# Patient Record
Sex: Male | Born: 1985 | Race: White | Hispanic: No | Marital: Married | Smoking: Never smoker
Health system: Southern US, Community
[De-identification: ages and names within clinical notes are randomized; demographics above are authoritative.]

## PROBLEM LIST (undated history)

## (undated) DIAGNOSIS — G43909 Migraine, unspecified, not intractable, without status migrainosus: Secondary | ICD-10-CM

## (undated) DIAGNOSIS — K819 Cholecystitis, unspecified: Secondary | ICD-10-CM

## (undated) DIAGNOSIS — F419 Anxiety disorder, unspecified: Secondary | ICD-10-CM

## (undated) DIAGNOSIS — K838 Other specified diseases of biliary tract: Secondary | ICD-10-CM

## (undated) HISTORY — DX: Other specified diseases of biliary tract: K83.8

## (undated) HISTORY — DX: Cholecystitis, unspecified: K81.9

## (undated) HISTORY — DX: Migraine, unspecified, not intractable, without status migrainosus: G43.909

## (undated) HISTORY — DX: Anxiety disorder, unspecified: F41.9

---

## 2017-01-07 ENCOUNTER — Encounter: Payer: Self-pay | Admitting: Emergency Medicine

## 2017-01-07 ENCOUNTER — Emergency Department
Admission: EM | Admit: 2017-01-07 | Discharge: 2017-01-07 | Disposition: A | Discharge status: Home / Self Care | Payer: Self-pay | Attending: Emergency Medicine | Admitting: Emergency Medicine

## 2017-01-07 ENCOUNTER — Telehealth: Payer: Self-pay

## 2017-01-07 ENCOUNTER — Emergency Department: Payer: Self-pay

## 2017-01-07 DIAGNOSIS — R079 Chest pain, unspecified: Secondary | ICD-10-CM | POA: Insufficient documentation

## 2017-01-07 LAB — CBC
HEMATOCRIT: 45.2 % (ref 40.0–52.0)
HEMOGLOBIN: 15.3 g/dL (ref 13.0–18.0)
MCH: 29.8 pg (ref 26.0–34.0)
MCHC: 33.9 g/dL (ref 32.0–36.0)
MCV: 87.9 fL (ref 80.0–100.0)
Platelets: 207 10*3/uL (ref 150–440)
RBC: 5.15 MIL/uL (ref 4.40–5.90)
RDW: 14 % (ref 11.5–14.5)
WBC: 8.7 10*3/uL (ref 3.8–10.6)

## 2017-01-07 LAB — BASIC METABOLIC PANEL
ANION GAP: 6 (ref 5–15)
BUN: 18 mg/dL (ref 6–20)
CALCIUM: 9.5 mg/dL (ref 8.9–10.3)
CHLORIDE: 106 mmol/L (ref 101–111)
CO2: 28 mmol/L (ref 22–32)
Creatinine, Ser: 1.11 mg/dL (ref 0.61–1.24)
GFR calc Af Amer: >60 mL/min (ref >60)
GFR calc non Af Amer: >60 mL/min (ref >60)
GLUCOSE: 113 mg/dL — AB (ref 65–99)
POTASSIUM: 3.4 mmol/L — AB (ref 3.5–5.1)
Sodium: 140 mmol/L (ref 135–145)

## 2017-01-07 LAB — TROPONIN I: Troponin I: <0.03 ng/mL (ref <0.03)

## 2017-01-07 NOTE — ED Triage Notes (Signed)
Pt to triage via EMS from home, report CP starting around midnight, radiating to back, w/ SOB and nausea, report took 4 ASA PTA.  Pt reports pain now resolved.

## 2017-01-07 NOTE — ED Provider Notes (Signed)
[facility] Emergency Department Provider Note   ____________________________________________   First MD Initiated Contact with Patient 01/07/17 [id number]     (approximate)  I have reviewed the triage vital signs and the nursing notes.   HISTORY  Chief Complaint Chest Pain    HPI William Dennis is a 31 y.o. male brought to the ED from home via EMS with a chief of chest pain. Patient reports onset of chest pain radiating to his back approximately midnight while getting ready for bed. Describes chest pressure associated with mild shortness of breath and nausea. Denies associated diaphoresis, vomiting, dizziness, palpitations. EMS administered 4 baby aspirin with resolution of symptoms. Patient states he has had more stress recently in his life. Had a similar episode 2 years ago with negative cardiology workup. Cold-like symptoms with cough and chest congestion approximately 2 weeks ago. Denies recent fever, chills, abdominal pain, diarrhea.Denies recent travel, trauma or hormone use.   Past medical history None  There are no active problems to display for this patient.   History reviewed. No pertinent surgical history.  Prior to Admission medications   Not on File    Allergies Patient has no known allergies.  Family history Maternal grandfather with CAD  Social History Social History  Substance Use Topics  . Smoking status: Never Smoker  . Smokeless tobacco: Never Used  . Alcohol use No  Denies illicit drug use.  Review of Systems  Constitutional: No fever/chills. Eyes: No visual changes. ENT: No sore throat. Cardiovascular: Positive for chest pain. Respiratory: Positive for shortness of breath. Gastrointestinal: No abdominal pain.  Positive for nausea, no vomiting.  No diarrhea.  No constipation. Genitourinary: Negative for dysuria. Musculoskeletal: Negative for back pain. Skin: Negative for rash. Neurological: Negative for headaches,  focal weakness or numbness.   ____________________________________________   PHYSICAL EXAM:  VITAL SIGNS: ED Triage Vitals  Enc Vitals Group     BP 01/07/17 0141 117/70     Pulse Rate 01/07/17 0141 63     Resp 01/07/17 0141 16     Temp 01/07/17 0141 98.3 F (36.8 C)     Temp Source 01/07/17 0141 Oral     SpO2 01/07/17 0141 95 %     Weight 01/07/17 0142 220 lb (99.8 kg)     Height 01/07/17 0142 6' (1.829 m)     Head Circumference --      Peak Flow --      Pain Score 01/07/17 0141 0     Pain Loc --      Pain Edu? --      Excl. in GC? --     Constitutional: Alert and oriented. Well appearing and in no acute distress. Eyes: Conjunctivae are normal. PERRL. EOMI. Head: Atraumatic. Nose: No congestion/rhinnorhea. Mouth/Throat: Mucous membranes are moist.  Oropharynx non-erythematous. Neck: No stridor.  No carotid bruits. Cardiovascular: Normal rate, regular rhythm. Grossly normal heart sounds.  Good peripheral circulation. Respiratory: Normal respiratory effort.  No retractions. Lungs CTAB. Gastrointestinal: Soft and nontender. No distention. No abdominal bruits. No CVA tenderness. Musculoskeletal: No lower extremity tenderness nor edema.  No joint effusions. Neurologic:  Normal speech and language. No gross focal neurologic deficits are appreciated. No gait instability. Skin:  Skin is warm, dry and intact. No rash noted. Psychiatric: Mood and affect are normal. Speech and behavior are normal.  ____________________________________________   LABS (all labs ordered are listed, but only abnormal results are displayed)  Labs Reviewed  BASIC METABOLIC PANEL - Abnormal; Notable for  the following:       Result Value   Potassium 3.4 (*)    Glucose, Bld 113 (*)    All other components within normal limits  CBC  TROPONIN I  TROPONIN I   ____________________________________________  EKG  ED ECG REPORT I, SUNG,JADE J, the attending physician, personally viewed and  interpreted this ECG.   Date: 01/07/2017  EKG Time: 0140  Rate: 58  Rhythm: sinus bradycardia  Axis: Normal  Intervals:none  ST&T Change: Nonspecific  ____________________________________________  RADIOLOGY  Dg Chest 2 View  Result Date: 01/07/2017 CLINICAL DATA:  Chest pain EXAM: CHEST  2 VIEW COMPARISON:  None. FINDINGS: The heart size and mediastinal contours are within normal limits. Both lungs are clear. The visualized skeletal structures are unremarkable. IMPRESSION: No active cardiopulmonary disease. Electronically Signed   By: Jasmine Pang M.D.   On: 01/07/2017 02:09    ____________________________________________   PROCEDURES  Procedure(s) performed: None  Procedures  Critical Care performed: No  ____________________________________________   INITIAL IMPRESSION / ASSESSMENT AND PLAN / ED COURSE  Pertinent labs & imaging results that were available during my care of the patient were reviewed by me and considered in my medical decision making (see chart for details).  31 year old male, otherwise healthy, who presents with chest pain. No pain currently. Initial EKG and troponin are unremarkable. Will repeat timed troponin. Low suspicion for CAD, PE, dissection.  ----------------------------------------- 5:19 AM on 01/07/2017 -----------------------------------------   OBSERVATION CARE: This patient is being placed under observation care for the following reasons: Chest pain with repeat testing to rule out ischemia    ----------------------------------------- 5:50 AM on 01/07/2017 -----------------------------------------  Resting in no acute distress.  ----------------------------------------- 6:14 AM on 01/07/2017 -----------------------------------------   END OF OBSERVATION STATUS: After an appropriate period of observation, this patient is being discharged due to the following reason(s):  Repeat negative troponin, no chest pain. Strict return  precautions given. Patient verbalizes understanding and agrees with plan of care.      ____________________________________________   FINAL CLINICAL IMPRESSION(S) / ED DIAGNOSES  Final diagnoses:  Chest pain, unspecified type      NEW MEDICATIONS STARTED DURING THIS VISIT:  New Prescriptions   No medications on file     Note:  This document was prepared using Dragon voice recognition software and may include unintentional dictation errors.    Irean Hong, MD 01/07/17 [phone]

## 2017-01-07 NOTE — Telephone Encounter (Signed)
Lmov for patient to call back and schedule ED fu   Seen on 01/06/17 for CP   Will try again at a later time

## 2017-01-07 NOTE — Discharge Instructions (Signed)
Return to the ER for recurrent or worsening symptoms, persistent vomiting, difficulty breathing or other concerns. 

## 2017-01-18 NOTE — Telephone Encounter (Signed)
Lmov for patient to call back and schedule ED fu   Seen on 01/06/17 for CP   Will try again at a later time

## 2017-01-20 NOTE — Telephone Encounter (Signed)
Unable to contact °Sent letter  °

## 2018-08-22 ENCOUNTER — Other Ambulatory Visit: Payer: Self-pay

## 2018-08-22 ENCOUNTER — Emergency Department
Admission: EM | Admit: 2018-08-22 | Discharge: 2018-08-22 | Disposition: A | Discharge status: Home / Self Care | Payer: BLUE CROSS/BLUE SHIELD | Attending: Emergency Medicine | Admitting: Emergency Medicine

## 2018-08-22 ENCOUNTER — Encounter: Payer: Self-pay | Admitting: Emergency Medicine

## 2018-08-22 ENCOUNTER — Emergency Department: Payer: BLUE CROSS/BLUE SHIELD

## 2018-08-22 DIAGNOSIS — M549 Dorsalgia, unspecified: Secondary | ICD-10-CM | POA: Insufficient documentation

## 2018-08-22 DIAGNOSIS — R0602 Shortness of breath: Secondary | ICD-10-CM | POA: Insufficient documentation

## 2018-08-22 LAB — CBC WITH DIFFERENTIAL/PLATELET
Abs Immature Granulocytes: 0.03 10*3/uL (ref 0.00–0.07)
Basophils Absolute: 0.1 10*3/uL (ref 0.0–0.1)
Basophils Relative: 1 %
EOS ABS: 0.2 10*3/uL (ref 0.0–0.5)
Eosinophils Relative: 2 %
HEMATOCRIT: 45.3 % (ref 39.0–52.0)
Hemoglobin: 15.1 g/dL (ref 13.0–17.0)
Immature Granulocytes: 0 %
LYMPHS ABS: 3.1 10*3/uL (ref 0.7–4.0)
Lymphocytes Relative: 31 %
MCH: 29.5 pg (ref 26.0–34.0)
MCHC: 33.3 g/dL (ref 30.0–36.0)
MCV: 88.5 fL (ref 80.0–100.0)
MONOS PCT: 10 %
Monocytes Absolute: 1 10*3/uL (ref 0.1–1.0)
Neutro Abs: 5.7 10*3/uL (ref 1.7–7.7)
Neutrophils Relative %: 56 %
Platelets: 256 10*3/uL (ref 150–400)
RBC: 5.12 MIL/uL (ref 4.22–5.81)
RDW: 13.3 % (ref 11.5–15.5)
WBC: 10 10*3/uL (ref 4.0–10.5)
nRBC: 0 % (ref 0.0–0.2)

## 2018-08-22 LAB — COMPREHENSIVE METABOLIC PANEL
ALT: 39 U/L (ref 0–44)
ANION GAP: 6 (ref 5–15)
AST: 27 U/L (ref 15–41)
Albumin: 4.5 g/dL (ref 3.5–5.0)
Alkaline Phosphatase: 72 U/L (ref 38–126)
BILIRUBIN TOTAL: 0.5 mg/dL (ref 0.3–1.2)
BUN: 14 mg/dL (ref 6–20)
CALCIUM: 9 mg/dL (ref 8.9–10.3)
CO2: 28 mmol/L (ref 22–32)
CREATININE: 1.09 mg/dL (ref 0.61–1.24)
Chloride: 106 mmol/L (ref 98–111)
GFR calc Af Amer: >60 mL/min (ref >60)
Glucose, Bld: 111 mg/dL — ABNORMAL HIGH (ref 70–99)
POTASSIUM: 3.7 mmol/L (ref 3.5–5.1)
Sodium: 140 mmol/L (ref 135–145)
Total Protein: 7.7 g/dL (ref 6.5–8.1)

## 2018-08-22 LAB — URINALYSIS, COMPLETE (UACMP) WITH MICROSCOPIC
Bacteria, UA: NONE SEEN
Bilirubin Urine: NEGATIVE
GLUCOSE, UA: NEGATIVE mg/dL
Hgb urine dipstick: NEGATIVE
Ketones, ur: NEGATIVE mg/dL
Leukocytes,Ua: NEGATIVE
Nitrite: NEGATIVE
Protein, ur: NEGATIVE mg/dL
Specific Gravity, Urine: 1.02 (ref 1.005–1.030)
Squamous Epithelial / LPF: NONE SEEN (ref 0–5)
pH: 5 (ref 5.0–8.0)

## 2018-08-22 LAB — TROPONIN I

## 2018-08-22 LAB — LIPASE, BLOOD: LIPASE: 39 U/L (ref 11–51)

## 2018-08-22 MED ORDER — IBUPROFEN 600 MG PO TABS: 600.0000 mg | ORAL_TABLET | Freq: Four times a day (QID) | ORAL | 0 refills | Status: DC | PRN

## 2018-08-22 MED ORDER — TRAMADOL HCL 50 MG PO TABS: 50.0000 mg | ORAL_TABLET | Freq: Four times a day (QID) | ORAL | 0 refills | Status: DC | PRN

## 2018-08-22 NOTE — ED Triage Notes (Signed)
Patient ambulatory to triage with steady gait, without difficulty or distress noted; pt reports since yesterday having back pain--st begins in upper back radiating down into lower back and around into lower abd with no accomp symptoms

## 2018-08-22 NOTE — ED Provider Notes (Signed)
[facility] Emergency Department Provider Note ____________________________________________   First MD Initiated Contact with Patient 08/22/18 2112     (approximate)  I have reviewed the triage vital signs and the nursing notes.   HISTORY  Chief Complaint Back Pain    HPI Nerick Hillson is a 33 y.o. male with no significant PMH who presents with back pain, acute onset yesterday and gradually worsening, now radiating around to his chest and to the lateral parts of the abdomen, more on the left.  He states that he was doing some yard work the day prior but no other changes in activity and he denies any specific injury.  The patient reports that the pain has made him feel slightly short of breath but he denies weakness or lightheadedness, fever, vomiting, diarrhea, or urinary symptoms.  No prior history of this pain.    History reviewed. No pertinent past medical history.  There are no active problems to display for this patient.   History reviewed. No pertinent surgical history.  Prior to Admission medications   Medication Sig Start Date End Date Taking? Authorizing Provider  ibuprofen (ADVIL,MOTRIN) 600 MG tablet Take 1 tablet (600 mg total) by mouth every 6 (six) hours as needed. 08/22/18   Dionne Bucy, MD  traMADol (ULTRAM) 50 MG tablet Take 1 tablet (50 mg total) by mouth every 6 (six) hours as needed for up to 5 days for moderate pain. 08/22/18 08/27/18  Dionne Bucy, MD    Allergies Codeine  No family history on file.  Social History Social History   Tobacco Use  . Smoking status: Never Smoker  . Smokeless tobacco: Never Used  Substance Use Topics  . Alcohol use: No  . Drug use: Not on file    Review of Systems  Constitutional: No fever. Eyes: No redness. ENT: No neck pain. Cardiovascular: Positive chest pain. Respiratory: Denies shortness of breath. Gastrointestinal: No vomiting or diarrhea. Genitourinary: Negative for  dysuria.  Musculoskeletal: Positive for back pain. Skin: Negative for rash. Neurological: Negative for headaches, focal weakness or numbness.   ____________________________________________   PHYSICAL EXAM:  VITAL SIGNS: ED Triage Vitals  Enc Vitals Group     BP 08/22/18 2002 (!) 141/89     Pulse Rate 08/22/18 2002 93     Resp 08/22/18 2002 18     Temp 08/22/18 2002 98.1 F (36.7 C)     Temp Source 08/22/18 2002 Oral     SpO2 08/22/18 2002 98 %     Weight 08/22/18 2003 223 lb (101.2 kg)     Height 08/22/18 2003 6' (1.829 m)     Head Circumference --      Peak Flow --      Pain Score 08/22/18 2002 9     Pain Loc --      Pain Edu? --      Excl. in GC? --     Constitutional: Alert and oriented. Well appearing and in no acute distress. Eyes: Conjunctivae are normal.  Head: Atraumatic. Nose: No congestion/rhinnorhea. Mouth/Throat: Mucous membranes are moist.   Neck: Normal range of motion.  No cervical spinal tenderness. Cardiovascular: Normal rate, regular rhythm. Grossly normal heart sounds.  Good peripheral circulation. Respiratory: Normal respiratory effort.  No retractions. Lungs CTAB. Gastrointestinal: Soft and nontender. No distention.  Genitourinary: No flank tenderness. Musculoskeletal: No lower extremity edema.  Extremities warm and well perfused.  No midline or paraspinal back tenderness. Neurologic:  Normal speech and language. No gross focal neurologic  deficits are appreciated.  Skin:  Skin is warm and dry. No rash noted. Psychiatric: Mood and affect are normal. Speech and behavior are normal.  ____________________________________________   LABS (all labs ordered are listed, but only abnormal results are displayed)  Labs Reviewed  COMPREHENSIVE METABOLIC PANEL - Abnormal; Notable for the following components:      Result Value   Glucose, Bld 111 (*)    All other components within normal limits  URINALYSIS, COMPLETE (UACMP) WITH MICROSCOPIC - Abnormal;  Notable for the following components:   Color, Urine YELLOW (*)    APPearance CLEAR (*)    All other components within normal limits  CBC WITH DIFFERENTIAL/PLATELET  TROPONIN I  LIPASE, BLOOD   ____________________________________________  EKG ED ECG REPORT I, Dionne Bucy, the attending physician, personally viewed and interpreted this ECG.  Date: 08/22/2018 EKG Time: 2005 Rate: 98 Rhythm: normal sinus rhythm QRS Axis: normal Intervals: normal ST/T Wave abnormalities: Nonspecific T wave inversion in III Narrative Interpretation: no evidence of acute ischemia  ____________________________________________  RADIOLOGY  XR: No focal infiltrate or other acute abnormality  ____________________________________________   PROCEDURES  Procedure(s) performed: No  Procedures  Critical Care performed: No ____________________________________________   INITIAL IMPRESSION / ASSESSMENT AND PLAN / ED COURSE  Pertinent labs & imaging results that were available during my care of the patient were reviewed by me and considered in my medical decision making (see chart for details).  33 year old male with no significant PMH presents with upper to mid back pain since yesterday, radiating around to his chest and to the left side of his abdomen with no weakness or neurologic symptoms.  On exam the patient is well-appearing and his vital signs are normal.  The remainder of the exam is unremarkable.  He has no midline spinal pain and his neuro exam is nonfocal.  EKG shows no significant abnormalities.  Overall presentation is most consistent with muscle strain/spasm versus mild dehydration, early viral syndrome, or other benign etiology.  We obtained basic labs, troponin, and chest x-ray all of which were negative.  Given the duration of the symptoms and the patient's low risk for ACS, there is no indication for repeat troponin.  ----------------------------------------- 11:05 PM on  08/22/2018 -----------------------------------------  Patient is stable for discharge home.  Return precautions given, and he expresses understanding. ____________________________________________   FINAL CLINICAL IMPRESSION(S) / ED DIAGNOSES  Final diagnoses:  Acute bilateral back pain, unspecified back location      NEW MEDICATIONS STARTED DURING THIS VISIT:  New Prescriptions   IBUPROFEN (ADVIL,MOTRIN) 600 MG TABLET    Take 1 tablet (600 mg total) by mouth every 6 (six) hours as needed.   TRAMADOL (ULTRAM) 50 MG TABLET    Take 1 tablet (50 mg total) by mouth every 6 (six) hours as needed for up to 5 days for moderate pain.     Note:  This document was prepared using Dragon voice recognition software and may include unintentional dictation errors.     Dionne Bucy, MD 08/22/18 [phone]

## 2018-08-22 NOTE — Discharge Instructions (Signed)
Take the ibuprofen up to every 6 hours (with food) for pain, and the tramadol only if needed for more severe pain not relieved by the ibuprofen.  Return to the ER for new, worsening, persistent back or chest pain, difficulty breathing, weakness or lightheadedness, fevers, vomiting, or any other new or worsening symptoms that concern you.

## 2018-08-25 ENCOUNTER — Other Ambulatory Visit: Payer: Self-pay

## 2018-08-25 ENCOUNTER — Inpatient Hospital Stay
Admission: EM | Admit: 2018-08-25 | Discharge: 2018-08-30 | DRG: 416 | Disposition: A | Discharge status: Home / Self Care | Payer: BLUE CROSS/BLUE SHIELD | Attending: Surgery | Admitting: Surgery

## 2018-08-25 ENCOUNTER — Emergency Department: Payer: BLUE CROSS/BLUE SHIELD

## 2018-08-25 DIAGNOSIS — T81509A Unspecified complication of foreign body accidentally left in body following unspecified procedure, initial encounter: Secondary | ICD-10-CM

## 2018-08-25 DIAGNOSIS — Z9049 Acquired absence of other specified parts of digestive tract: Secondary | ICD-10-CM

## 2018-08-25 DIAGNOSIS — K810 Acute cholecystitis: Secondary | ICD-10-CM

## 2018-08-25 DIAGNOSIS — K8000 Calculus of gallbladder with acute cholecystitis without obstruction: Principal | ICD-10-CM | POA: Diagnosis present

## 2018-08-25 DIAGNOSIS — R109 Unspecified abdominal pain: Secondary | ICD-10-CM | POA: Diagnosis not present

## 2018-08-25 DIAGNOSIS — Z5331 Laparoscopic surgical procedure converted to open procedure: Secondary | ICD-10-CM

## 2018-08-25 DIAGNOSIS — K819 Cholecystitis, unspecified: Secondary | ICD-10-CM

## 2018-08-25 DIAGNOSIS — K838 Other specified diseases of biliary tract: Secondary | ICD-10-CM

## 2018-08-25 DIAGNOSIS — Z885 Allergy status to narcotic agent status: Secondary | ICD-10-CM

## 2018-08-25 DIAGNOSIS — K660 Peritoneal adhesions (postprocedural) (postinfection): Secondary | ICD-10-CM | POA: Diagnosis present

## 2018-08-25 DIAGNOSIS — Y658 Other specified misadventures during surgical and medical care: Secondary | ICD-10-CM

## 2018-08-25 HISTORY — DX: Acute cholecystitis: K81.0

## 2018-08-25 LAB — CBC
HEMATOCRIT: 47.1 % (ref 39.0–52.0)
HEMOGLOBIN: 16 g/dL (ref 13.0–17.0)
MCH: 29.6 pg (ref 26.0–34.0)
MCHC: 34 g/dL (ref 30.0–36.0)
MCV: 87.1 fL (ref 80.0–100.0)
Platelets: 253 10*3/uL (ref 150–400)
RBC: 5.41 MIL/uL (ref 4.22–5.81)
RDW: 13.1 % (ref 11.5–15.5)
WBC: 10.3 10*3/uL (ref 4.0–10.5)
nRBC: 0 % (ref 0.0–0.2)

## 2018-08-25 LAB — BASIC METABOLIC PANEL
Anion gap: 7 (ref 5–15)
BUN: 12 mg/dL (ref 6–20)
CHLORIDE: 102 mmol/L (ref 98–111)
CO2: 29 mmol/L (ref 22–32)
Calcium: 9.7 mg/dL (ref 8.9–10.3)
Creatinine, Ser: 1.04 mg/dL (ref 0.61–1.24)
GFR calc Af Amer: >60 mL/min (ref >60)
GLUCOSE: 99 mg/dL (ref 70–99)
POTASSIUM: 3.8 mmol/L (ref 3.5–5.1)
Sodium: 138 mmol/L (ref 135–145)

## 2018-08-25 LAB — URINALYSIS, COMPLETE (UACMP) WITH MICROSCOPIC
Bacteria, UA: NONE SEEN
Bilirubin Urine: NEGATIVE
GLUCOSE, UA: NEGATIVE mg/dL
Ketones, ur: 5 mg/dL — AB
Leukocytes,Ua: NEGATIVE
NITRITE: NEGATIVE
PROTEIN: NEGATIVE mg/dL
Specific Gravity, Urine: 1.017 (ref 1.005–1.030)
Squamous Epithelial / LPF: NONE SEEN (ref 0–5)
pH: 5 (ref 5.0–8.0)

## 2018-08-25 LAB — HEPATIC FUNCTION PANEL
ALBUMIN: 4.3 g/dL (ref 3.5–5.0)
ALK PHOS: 75 U/L (ref 38–126)
ALT: 80 U/L — ABNORMAL HIGH (ref 0–44)
AST: 39 U/L (ref 15–41)
BILIRUBIN TOTAL: 1.2 mg/dL (ref 0.3–1.2)
Bilirubin, Direct: 0.2 mg/dL (ref 0.0–0.2)
Indirect Bilirubin: 1 mg/dL — ABNORMAL HIGH (ref 0.3–0.9)
Total Protein: 8.2 g/dL — ABNORMAL HIGH (ref 6.5–8.1)

## 2018-08-25 LAB — AMYLASE: Amylase: 37 U/L (ref 28–100)

## 2018-08-25 LAB — LIPASE, BLOOD: Lipase: 26 U/L (ref 11–51)

## 2018-08-25 MED ORDER — DOCUSATE SODIUM 100 MG PO CAPS: 100.0000 mg | ORAL_CAPSULE | Freq: Two times a day (BID) | ORAL | Status: DC | PRN

## 2018-08-25 MED ORDER — SIMETHICONE 80 MG PO CHEW
80.0000 mg | CHEWABLE_TABLET | Freq: Once | ORAL | Status: DC
  Filled 2018-08-25: qty 1

## 2018-08-25 MED ORDER — SODIUM CHLORIDE 0.9 % IV SOLN
2.0000 g | INTRAVENOUS | Status: DC
  Administered 2018-08-25 – 2018-08-28 (×4): 2 g via INTRAVENOUS
  Filled 2018-08-25 (×2): qty 2
  Filled 2018-08-25: qty 20
  Filled 2018-08-25 (×2): qty 2

## 2018-08-25 MED ORDER — ACETAMINOPHEN 650 MG RE SUPP: 650.0000 mg | Freq: Four times a day (QID) | RECTAL | Status: DC | PRN

## 2018-08-25 MED ORDER — LACTATED RINGERS IV SOLN
INTRAVENOUS | Status: DC
  Administered 2018-08-26 – 2018-08-29 (×6): via INTRAVENOUS

## 2018-08-25 MED ORDER — MORPHINE SULFATE (PF) 2 MG/ML IV SOLN
2.0000 mg | INTRAVENOUS | Status: DC | PRN
  Administered 2018-08-26 (×2): 2 mg via INTRAVENOUS
  Filled 2018-08-25 (×2): qty 1

## 2018-08-25 MED ORDER — IBUPROFEN 400 MG PO TABS: 600.0000 mg | ORAL_TABLET | Freq: Four times a day (QID) | ORAL | Status: DC | PRN

## 2018-08-25 MED ORDER — ONDANSETRON HCL 4 MG/2ML IJ SOLN: 4.0000 mg | Freq: Four times a day (QID) | INTRAMUSCULAR | Status: DC | PRN

## 2018-08-25 MED ORDER — ONDANSETRON 4 MG PO TBDP: 4.0000 mg | ORAL_TABLET | Freq: Four times a day (QID) | ORAL | Status: DC | PRN

## 2018-08-25 MED ORDER — HYDROCODONE-ACETAMINOPHEN 5-325 MG PO TABS
1.0000 | ORAL_TABLET | Freq: Once | ORAL | Status: AC
  Administered 2018-08-25: 1 via ORAL
  Filled 2018-08-25: qty 1

## 2018-08-25 MED ORDER — SENNOSIDES-DOCUSATE SODIUM 8.6-50 MG PO TABS
1.0000 | ORAL_TABLET | Freq: Once | ORAL | Status: AC
  Administered 2018-08-27: 1 via ORAL
  Filled 2018-08-25: qty 1

## 2018-08-25 MED ORDER — ACETAMINOPHEN 325 MG PO TABS: 650.0000 mg | ORAL_TABLET | Freq: Four times a day (QID) | ORAL | Status: DC | PRN

## 2018-08-25 MED ORDER — HYDROCODONE-ACETAMINOPHEN 5-325 MG PO TABS
1.0000 | ORAL_TABLET | ORAL | Status: DC | PRN
  Administered 2018-08-25 – 2018-08-27 (×3): 2 via ORAL
  Filled 2018-08-25 (×5): qty 2

## 2018-08-25 MED ORDER — ONDANSETRON 4 MG PO TBDP
4.0000 mg | ORAL_TABLET | Freq: Once | ORAL | Status: AC
  Administered 2018-08-25: 4 mg via ORAL
  Filled 2018-08-25: qty 1

## 2018-08-25 MED ORDER — MORPHINE SULFATE (PF) 2 MG/ML IV SOLN
2.0000 mg | Freq: Once | INTRAVENOUS | Status: AC
  Administered 2018-08-25: 2 mg via INTRAVENOUS
  Filled 2018-08-25: qty 1

## 2018-08-25 NOTE — ED Triage Notes (Signed)
Pt c/o left back pain from top to lower since 2/10. States he was seen here on 2/11 for sx and told to come back if the sx did not improve or had nausea or fever. Pt states the sx have worsened and is having nausea now.

## 2018-08-25 NOTE — ED Notes (Signed)
See triage note  Presents with lower back pain  States pain radiates across lower back but not into legs slight nausea yesterday

## 2018-08-25 NOTE — ED Provider Notes (Addendum)
[facility] Emergency Department Provider Note ____________________________________________  Time seen: 1238  I have reviewed the triage vital signs and the nursing notes.  HISTORY  Chief Complaint  Flank Pain  HPI William Dennis is a 33 y.o. male presents himself to the ED for recurrent back pain.  Patient was seen in the ED about 3 days prior, with complaints of left-sided mid back pain with referral towards the lower spine and abdomen since that 2/10.  He was evaluated with labs which were normal, and diagnosed with a likely musculoskeletal strain.  He was discharged with prescriptions for ibuprofen and tramadol.  He presents now because he has had increased symptoms as well as nausea and anorexia.  He denies any recent accident, injury, or trauma.  Patient denies any abdominal pain, flank pain, or chest pain.  He does report some urinary hesitancy but denies any outright retention or hematuria.  He denies a history of kidney stones, ulcers, pancreatitis, or gallstones.  History reviewed. No pertinent past medical history.  There are no active problems to display for this patient.  History reviewed. No pertinent surgical history.  Prior to Admission medications   Medication Sig Start Date End Date Taking? Authorizing Provider  ibuprofen (ADVIL,MOTRIN) 600 MG tablet Take 1 tablet (600 mg total) by mouth every 6 (six) hours as needed. 08/22/18   Dionne Bucy, MD  traMADol (ULTRAM) 50 MG tablet Take 1 tablet (50 mg total) by mouth every 6 (six) hours as needed for up to 5 days for moderate pain. 08/22/18 08/27/18  Dionne Bucy, MD   Allergies Codeine  No family history on file.  Social History Social History   Tobacco Use  . Smoking status: Never Smoker  . Smokeless tobacco: Never Used  Substance Use Topics  . Alcohol use: No  . Drug use: Not on file    Review of Systems  Constitutional: Negative for fever. Eyes: Negative for visual  changes. ENT: Negative for sore throat. Cardiovascular: Negative for chest pain. Respiratory: Negative for shortness of breath. Gastrointestinal: Negative for abdominal pain, vomiting and diarrhea.  Reports nausea and anorexia. Genitourinary: Negative for dysuria.  Reports urinary hesitancy and low volume. Musculoskeletal: Positive for back pain. Skin: Negative for rash. Neurological: Negative for headaches, focal weakness or numbness. ____________________________________________  PHYSICAL EXAM:  VITAL SIGNS: ED Triage Vitals  Enc Vitals Group     BP 08/25/18 1101 (!) 146/92     Pulse Rate 08/25/18 1101 (!) 104     Resp 08/25/18 1101 20     Temp 08/25/18 1101 98 F (36.7 C)     Temp Source 08/25/18 1101 Oral     SpO2 08/25/18 1101 98 %     Weight 08/25/18 1105 222 lb 14.2 oz (101.1 kg)     Height 08/25/18 1105 6' (1.829 m)     Head Circumference --      Peak Flow --      Pain Score 08/25/18 1105 10     Pain Loc --      Pain Edu? --      Excl. in GC? --     Constitutional: Alert and oriented. Well appearing and in no distress. Head: Normocephalic and atraumatic. Eyes: Conjunctivae are normal. Normal extraocular movements Cardiovascular: Normal rate, regular rhythm. Normal distal pulses. Respiratory: Normal respiratory effort. No wheezes/rales/rhonchi. Gastrointestinal: Soft and nontender. No distention, rebound,  guarding, or rigidity. Murphy's sign is negative.  Normoactive bowel sounds noted. Musculoskeletal: Normal spinal alignment without midline tenderness, spasm,  deformity, or step-off.  Patient is without tenderness to palpation to the left thoracolumbar region, which is where he localizes his previous pains.  He is asymptomatic and without pain at this time.  Nontender with normal range of motion in all extremities.  Neurologic:  Normal gait without ataxia. Normal speech and language. No gross focal neurologic deficits are appreciated. Skin:  Skin is warm, dry and  intact. No rash noted. Psychiatric: Mood and affect are normal. Patient exhibits appropriate insight and judgment. ____________________________________________   LABS (pertinent positives/negatives) Labs Reviewed  URINALYSIS, COMPLETE (UACMP) WITH MICROSCOPIC - Abnormal; Notable for the following components:      Result Value   Color, Urine YELLOW (*)    APPearance CLEAR (*)    Hgb urine dipstick SMALL (*)    Ketones, ur 5 (*)    All other components within normal limits  HEPATIC FUNCTION PANEL - Abnormal; Notable for the following components:   Total Protein 8.2 (*)    ALT 80 (*)    Indirect Bilirubin 1.0 (*)    All other components within normal limits  BASIC METABOLIC PANEL  CBC  LIPASE, BLOOD  AMYLASE  ____________________________________________   RADIOLOGY  CT Renal Stone Study  IMPRESSION: Dilated gallbladder is noted with surrounding inflammatory changes, with large gallstone noted in neck of gallbladder and smaller calculi noted in the fundus. This is concerning for acute cholecystitis. HIDA scan may be performed for further evaluation.  No hydronephrosis or renal obstruction is noted. No renal or ureteral calculi are noted. ____________________________________________  PROCEDURES  Procedures Zofran 4 mg ODT Norco 5-325 mg PO Morphine 2 mg IVP ____________________________________________  INITIAL IMPRESSION / ASSESSMENT AND PLAN / ED COURSE ----------------------------------------- 4:20 PM on 08/25/2018 -----------------------------------------  Page to Dr. Tonna Boehringer: he will evaluate the patient in the ED  ----------------------------------------- 5:04 PM on 08/25/2018 -----------------------------------------  Patient with ED evaluation and subsequent visit for left-sided mid back/flank pain, anorexia, and nausea.  His exam was concerning for possible renal calculi so a CT stone study was ordered.  His kidneys are clear of any stones, but his  gallbladder was inflamed, and showed a large stone in the neck.  Patient's other labs are reassuring at this time.  He has been afebrile and stable during his course in the ED.  Dr. Tonna Boehringer evaluated the patient and will admit him for lap chole tomorrow.  ____________________________________________  FINAL CLINICAL IMPRESSION(S) / ED DIAGNOSES  Final diagnoses:  Cholecystitis      Karmen Stabs, Charlesetta Ivory, PA-C 08/25/18 [address], Charlesetta Ivory, PA-C 08/25/18 1729    Arnaldo Natal, MD 08/25/18 2105

## 2018-08-25 NOTE — H&P (Signed)
Subjective:   CC: left flank pain  HPI:  William Dennis is a 33 y.o. male who is consulted by [facility] for evaluation of above cc.  Symptoms were first noted 3 days ago. Pain is intermittent, sharp.  Associated with rare nausea, exacerbated by nothing specific.  Sudden onset, no history of trauma or changes in activities.  No bowel movement since the pain started 3 days ago.  Usually has daily bowel movements.  Work-up in the ED initially 3 days ago was unremarkable and was diagnosed with a muscle strain and sent home.  Returns today because of the persistent pain and no improvement.  He states that since the pain has started he feels like he is unable to completely void.  Denies any dysuria or hematuria.  No changes in bowel movements prior to the pain onset.  No recent travel or change in diet.     Past Medical History: Denies any  Past Surgical History: Denies any  Family History: Reviewed and not relevant to current complaint  Social History:  reports that he has never smoked. He has never used smokeless tobacco. He reports that he does not drink alcohol. No history on file for drug.  Current Medications: none  Allergies:  Allergies as of 08/25/2018 - Review Complete 08/25/2018  Allergen Reaction Noted  . Codeine  08/22/2018    ROS:  General: Denies weight loss, weight gain, fatigue, fevers, chills, and night sweats. Eyes: Denies blurry vision, double vision, eye pain, itchy eyes, and tearing. Ears: Denies hearing loss, earache, and ringing in ears. Nose: Denies sinus pain, congestion, infections, runny nose, and nosebleeds. Mouth/throat: Denies hoarseness, sore throat, bleeding gums, and difficulty swallowing. Heart: Denies chest pain, palpitations, racing heart, irregular heartbeat, leg pain or swelling, and decreased activity tolerance. Respiratory: Denies breathing difficulty, shortness of breath, wheezing, cough, and sputum. GI: Denies change in appetite, heartburn, vomiting,   diarrhea, and blood in stool. GU: Denies pain with urinating, urgency, frequency, blood in urine. Musculoskeletal: Denies joint stiffness, pain, swelling, muscle weakness. Skin: Denies rash, itching, mass, tumors, sores, and boils Neurologic: Denies headache, fainting, dizziness, seizures, numbness, and tingling. Psychiatric: Denies depression, anxiety, difficulty sleeping, and memory loss. Endocrine: Denies heat or cold intolerance, and increased thirst or urination. Blood/lymph: Denies easy bruising, easy bruising, and swollen glands    Objective:     BP 122/89 (BP Location: Right Arm)   Pulse 89   Temp 98.7 F (37.1 C) (Oral)   Resp 20   Ht 6' (1.829 m)   Wt 101.1 kg   SpO2 (!) 89%   BMI 30.23 kg/m    Constitutional :  alert, cooperative, appears stated age and no distress  Lymphatics/Throat:  no asymmetry, masses, or scars  Respiratory:  clear to auscultation bilaterally  Cardiovascular:  regular rate and rhythm  Gastrointestinal: soft, no guarding, but focal tenderness noted in RUQ, none in LUQ.   Musculoskeletal: Steady gait and movement  Skin: Cool and moist  Psychiatric: Normal affect, non-agitated, not confused       LABS:  CMP Latest Ref Rng & Units 08/25/2018 08/22/2018 01/07/2017  Glucose 70 - 99 mg/dL 99 785(Y) 850(Y)  BUN 6 - 20 mg/dL 12 14 18   Creatinine 0.61 - 1.24 mg/dL 7.74 1.28 7.86  Sodium 135 - 145 mmol/L 138 140 140  Potassium 3.5 - 5.1 mmol/L 3.8 3.7 3.4(L)  Chloride 98 - 111 mmol/L 102 106 106  CO2 22 - 32 mmol/L 29 28 28   Calcium 8.9 - 10.3  mg/dL 9.7 9.0 9.5  Total Protein 6.5 - 8.1 g/dL 8.2(H) 7.7 -  Total Bilirubin 0.3 - 1.2 mg/dL 1.2 0.5 -  Alkaline Phos 38 - 126 U/L 75 72 -  AST 15 - 41 U/L 39 27 -  ALT 0 - 44 U/L 80(H) 39 -   CBC Latest Ref Rng & Units 08/25/2018 08/22/2018 01/07/2017  WBC 4.0 - 10.5 K/uL 10.3 10.0 8.7  Hemoglobin 13.0 - 17.0 g/dL 69.616.0 29.515.1 28.415.3  Hematocrit 39.0 - 52.0 % 47.1 45.3 45.2  Platelets 150 - 400 K/uL 253 256  207     RADS: CLINICAL DATA:  Acute left flank pain.  EXAM: CT ABDOMEN AND PELVIS WITHOUT CONTRAST  TECHNIQUE: Multidetector CT imaging of the abdomen and pelvis was performed following the standard protocol without IV contrast.  COMPARISON:  None.  FINDINGS: Lower chest: No acute abnormality.  Hepatobiliary: No biliary dilatation is noted. Probable large gallstone is noted in the neck of the gallbladder. Mild inflammatory changes are noted around the gallbladder with smaller calculi seen in the fundus, concerning for cholecystitis. No abnormality is seen in the liver on these unenhanced images.  Pancreas: Unremarkable. No pancreatic ductal dilatation or surrounding inflammatory changes.  Spleen: Normal in size without focal abnormality.  Adrenals/Urinary Tract: Adrenal glands are unremarkable. Kidneys are normal, without renal calculi, focal lesion, or hydronephrosis. Bladder is unremarkable.  Stomach/Bowel: Stomach is within normal limits. Appendix appears normal. No evidence of bowel wall thickening, distention, or inflammatory changes.  Vascular/Lymphatic: No significant vascular findings are present. No enlarged abdominal or pelvic lymph nodes.  Reproductive: Prostate is unremarkable.  Other: No abdominal wall hernia or abnormality. No abdominopelvic ascites.  Musculoskeletal: No acute or significant osseous findings.  IMPRESSION: Dilated gallbladder is noted with surrounding inflammatory changes, with large gallstone noted in neck of gallbladder and smaller calculi noted in the fundus. This is concerning for acute cholecystitis. HIDA scan may be performed for further evaluation.  No hydronephrosis or renal obstruction is noted. No renal or ureteral calculi are noted.   Electronically Signed   By: Lupita RaiderJames  Green Jr, M.D.   On: 08/25/2018 13:31 Assessment:      LEFT FLANK PAIN, incidental cholecystitis and RUQ pain  Plan:       Discussed the risk of surgery including post-op infxn, seroma, biloma, chronic pain, poor-delayed wound healing, retained gallstone, conversion to open procedure, post-op SBO or ileus, and need for additional procedures to address said risks.  The risks of general anesthetic including MI, CVA, sudden death or even reaction to anesthetic medications also discussed. Alternatives include continued observation.  Benefits include possible symptom relief, prevention of complications including acute cholecystitis, pancreatitis.  Typical post operative recovery of 3-5 days rest, continued pain in area and incision sites, possible loose stools up to 4-6 weeks, also discussed.  The patient understands the risks, any and all questions were answered to the patient's satisfaction.    I explained to the patient that his focal right upper quadrant tenderness along with findings on CT is consistent with acute cholecystitis.  Although this was not his primary reason for admission after discussion of the risk benefits and alternatives patient decided that he would like to proceed with a lap chole.  I specifically stated that his left flank pain may not change at all after the operation.  Patient did specifically acknowledge this, but still would like to proceed with a lap chole due to the incidental finding on CT as well as the point tenderness  on exam today.  Will admit start IV antibiotics and prepped for surgery.  Clear liquid diet for now n.p.o. after midnight.

## 2018-08-26 ENCOUNTER — Encounter: Payer: Self-pay | Admitting: Anesthesiology

## 2018-08-26 ENCOUNTER — Observation Stay: Payer: BLUE CROSS/BLUE SHIELD | Admitting: Anesthesiology

## 2018-08-26 ENCOUNTER — Encounter
Admission: EM | Disposition: A | Discharge status: Home / Self Care | Payer: Self-pay | Source: Home / Self Care | Attending: Surgery

## 2018-08-26 ENCOUNTER — Observation Stay: Payer: BLUE CROSS/BLUE SHIELD

## 2018-08-26 DIAGNOSIS — K810 Acute cholecystitis: Secondary | ICD-10-CM

## 2018-08-26 DIAGNOSIS — Z5331 Laparoscopic surgical procedure converted to open procedure: Secondary | ICD-10-CM

## 2018-08-26 HISTORY — PX: CHOLECYSTECTOMY: SHX55

## 2018-08-26 LAB — BASIC METABOLIC PANEL
Anion gap: 7 (ref 5–15)
BUN: 11 mg/dL (ref 6–20)
CO2: 29 mmol/L (ref 22–32)
Calcium: 9.1 mg/dL (ref 8.9–10.3)
Chloride: 101 mmol/L (ref 98–111)
Creatinine, Ser: 1.07 mg/dL (ref 0.61–1.24)
GFR calc Af Amer: >60 mL/min (ref >60)
Glucose, Bld: 91 mg/dL (ref 70–99)
Potassium: 3.7 mmol/L (ref 3.5–5.1)
Sodium: 137 mmol/L (ref 135–145)

## 2018-08-26 LAB — HEPATIC FUNCTION PANEL
ALT: 86 U/L — ABNORMAL HIGH (ref 0–44)
AST: 50 U/L — ABNORMAL HIGH (ref 15–41)
Albumin: 4.1 g/dL (ref 3.5–5.0)
Alkaline Phosphatase: 90 U/L (ref 38–126)
BILIRUBIN TOTAL: 1.8 mg/dL — AB (ref 0.3–1.2)
Bilirubin, Direct: 0.4 mg/dL — ABNORMAL HIGH (ref 0.0–0.2)
Indirect Bilirubin: 1.4 mg/dL — ABNORMAL HIGH (ref 0.3–0.9)
Total Protein: 8 g/dL (ref 6.5–8.1)

## 2018-08-26 LAB — CBC
HCT: 48.8 % (ref 39.0–52.0)
Hemoglobin: 16.1 g/dL (ref 13.0–17.0)
MCH: 29.3 pg (ref 26.0–34.0)
MCHC: 33 g/dL (ref 30.0–36.0)
MCV: 88.9 fL (ref 80.0–100.0)
Platelets: 242 10*3/uL (ref 150–400)
RBC: 5.49 MIL/uL (ref 4.22–5.81)
RDW: 12.9 % (ref 11.5–15.5)
WBC: 9.6 10*3/uL (ref 4.0–10.5)
nRBC: 0 % (ref 0.0–0.2)

## 2018-08-26 LAB — PHOSPHORUS: Phosphorus: 3.4 mg/dL (ref 2.5–4.6)

## 2018-08-26 LAB — MAGNESIUM: Magnesium: 2.1 mg/dL (ref 1.7–2.4)

## 2018-08-26 SURGERY — LAPAROSCOPIC CHOLECYSTECTOMY
Anesthesia: General

## 2018-08-26 MED ORDER — PROPOFOL 10 MG/ML IV BOLUS
INTRAVENOUS | Status: AC
  Filled 2018-08-26: qty 20

## 2018-08-26 MED ORDER — ONDANSETRON HCL 4 MG/2ML IJ SOLN
INTRAMUSCULAR | Status: DC | PRN
  Administered 2018-08-26: 4 mg via INTRAVENOUS

## 2018-08-26 MED ORDER — SODIUM CHLORIDE 0.9 % IV SOLN
INTRAVENOUS | Status: DC | PRN
  Administered 2018-08-26: 25 ug/min via INTRAVENOUS

## 2018-08-26 MED ORDER — SUGAMMADEX SODIUM 200 MG/2ML IV SOLN
INTRAVENOUS | Status: DC | PRN
  Administered 2018-08-26: 200 mg via INTRAVENOUS

## 2018-08-26 MED ORDER — ROCURONIUM BROMIDE 100 MG/10ML IV SOLN
INTRAVENOUS | Status: DC | PRN
  Administered 2018-08-26: 20 mg via INTRAVENOUS
  Administered 2018-08-26: 10 mg via INTRAVENOUS
  Administered 2018-08-26: 20 mg via INTRAVENOUS
  Administered 2018-08-26: 50 mg via INTRAVENOUS
  Administered 2018-08-26: 15 mg via INTRAVENOUS

## 2018-08-26 MED ORDER — ONDANSETRON HCL 4 MG/2ML IJ SOLN
INTRAMUSCULAR | Status: AC
  Filled 2018-08-26: qty 2

## 2018-08-26 MED ORDER — ACETAMINOPHEN 10 MG/ML IV SOLN
INTRAVENOUS | Status: DC | PRN
  Administered 2018-08-26: 1000 mg via INTRAVENOUS

## 2018-08-26 MED ORDER — FENTANYL CITRATE (PF) 100 MCG/2ML IJ SOLN
INTRAMUSCULAR | Status: DC | PRN
  Administered 2018-08-26 (×3): 50 ug via INTRAVENOUS
  Administered 2018-08-26: 100 ug via INTRAVENOUS
  Administered 2018-08-26: 50 ug via INTRAVENOUS

## 2018-08-26 MED ORDER — LIDOCAINE-EPINEPHRINE (PF) 1 %-1:200000 IJ SOLN
INTRAMUSCULAR | Status: DC | PRN
  Administered 2018-08-26: 60 mL

## 2018-08-26 MED ORDER — FENTANYL CITRATE (PF) 100 MCG/2ML IJ SOLN
INTRAMUSCULAR | Status: AC
  Filled 2018-08-26: qty 2

## 2018-08-26 MED ORDER — BUPIVACAINE HCL (PF) 0.5 % IJ SOLN
INTRAMUSCULAR | Status: AC
  Filled 2018-08-26: qty 30

## 2018-08-26 MED ORDER — MIDAZOLAM HCL 2 MG/2ML IJ SOLN
INTRAMUSCULAR | Status: DC | PRN
  Administered 2018-08-26: 2 mg via INTRAVENOUS

## 2018-08-26 MED ORDER — SODIUM CHLORIDE FLUSH 0.9 % IV SOLN
INTRAVENOUS | Status: AC
  Filled 2018-08-26: qty 70

## 2018-08-26 MED ORDER — HYDROMORPHONE HCL 1 MG/ML IJ SOLN
INTRAMUSCULAR | Status: DC | PRN
  Administered 2018-08-26: 1 mg via INTRAVENOUS

## 2018-08-26 MED ORDER — LIDOCAINE-EPINEPHRINE 1 %-1:100000 IJ SOLN
INTRAMUSCULAR | Status: AC
  Filled 2018-08-26: qty 2

## 2018-08-26 MED ORDER — SODIUM CHLORIDE 0.9 % IV SOLN
INTRAVENOUS | Status: DC | PRN
  Administered 2018-08-26: 90 mL

## 2018-08-26 MED ORDER — HYDROMORPHONE HCL 1 MG/ML IJ SOLN
INTRAMUSCULAR | Status: AC
  Filled 2018-08-26: qty 1

## 2018-08-26 MED ORDER — FENTANYL CITRATE (PF) 100 MCG/2ML IJ SOLN
25.0000 ug | INTRAMUSCULAR | Status: DC | PRN
  Administered 2018-08-26 (×4): 25 ug via INTRAVENOUS

## 2018-08-26 MED ORDER — DEXAMETHASONE SODIUM PHOSPHATE 10 MG/ML IJ SOLN
INTRAMUSCULAR | Status: AC
  Filled 2018-08-26: qty 1

## 2018-08-26 MED ORDER — ROCURONIUM BROMIDE 50 MG/5ML IV SOLN
INTRAVENOUS | Status: AC
  Filled 2018-08-26: qty 1

## 2018-08-26 MED ORDER — SUCCINYLCHOLINE CHLORIDE 20 MG/ML IJ SOLN
INTRAMUSCULAR | Status: AC
  Filled 2018-08-26: qty 1

## 2018-08-26 MED ORDER — ESMOLOL HCL 100 MG/10ML IV SOLN
INTRAVENOUS | Status: DC | PRN
  Administered 2018-08-26: 20 mg via INTRAVENOUS

## 2018-08-26 MED ORDER — ACETAMINOPHEN 10 MG/ML IV SOLN
INTRAVENOUS | Status: AC
  Filled 2018-08-26: qty 100

## 2018-08-26 MED ORDER — MIDAZOLAM HCL 2 MG/2ML IJ SOLN
INTRAMUSCULAR | Status: AC
  Filled 2018-08-26: qty 2

## 2018-08-26 MED ORDER — SUCRALFATE 1 GM/10ML PO SUSP
1.0000 g | Freq: Two times a day (BID) | ORAL | Status: DC
  Administered 2018-08-26 (×2): 1 g via ORAL
  Filled 2018-08-26 (×2): qty 10

## 2018-08-26 MED ORDER — BUPIVACAINE HCL (PF) 0.5 % IJ SOLN
INTRAMUSCULAR | Status: DC | PRN
  Administered 2018-08-26: 10 mL

## 2018-08-26 MED ORDER — PROPOFOL 10 MG/ML IV BOLUS
INTRAVENOUS | Status: DC | PRN
  Administered 2018-08-26: 200 mg via INTRAVENOUS

## 2018-08-26 MED ORDER — LIDOCAINE HCL (CARDIAC) PF 100 MG/5ML IV SOSY
PREFILLED_SYRINGE | INTRAVENOUS | Status: DC | PRN
  Administered 2018-08-26: 100 mg via INTRAVENOUS

## 2018-08-26 MED ORDER — BUPIVACAINE LIPOSOME 1.3 % IJ SUSP
INTRAMUSCULAR | Status: AC
  Filled 2018-08-26: qty 20

## 2018-08-26 MED ORDER — FENTANYL CITRATE (PF) 100 MCG/2ML IJ SOLN
INTRAMUSCULAR | Status: AC
  Administered 2018-08-26: 25 ug via INTRAVENOUS
  Filled 2018-08-26: qty 2

## 2018-08-26 MED ORDER — LIDOCAINE HCL (PF) 2 % IJ SOLN
INTRAMUSCULAR | Status: AC
  Filled 2018-08-26: qty 10

## 2018-08-26 MED ORDER — LACTATED RINGERS IV SOLN
INTRAVENOUS | Status: DC | PRN
  Administered 2018-08-26: 17:00:00 via INTRAVENOUS

## 2018-08-26 MED ORDER — HYDROMORPHONE HCL 1 MG/ML IJ SOLN
0.5000 mg | INTRAMUSCULAR | Status: DC | PRN
  Administered 2018-08-26 – 2018-08-29 (×14): 0.5 mg via INTRAVENOUS
  Filled 2018-08-26 (×14): qty 0.5

## 2018-08-26 MED ORDER — ONDANSETRON HCL 4 MG/2ML IJ SOLN
4.0000 mg | Freq: Once | INTRAMUSCULAR | Status: AC | PRN
  Administered 2018-08-26: 4 mg via INTRAVENOUS

## 2018-08-26 MED ORDER — SODIUM CHLORIDE 0.9 % IV SOLN
INTRAVENOUS | Status: DC | PRN
  Administered 2018-08-26: 14:00:00 via INTRAVENOUS

## 2018-08-26 MED ORDER — POLYETHYLENE GLYCOL 3350 17 G PO PACK
17.0000 g | PACK | Freq: Every day | ORAL | Status: DC
  Administered 2018-08-27 – 2018-08-30 (×3): 17 g via ORAL
  Filled 2018-08-26 (×3): qty 1

## 2018-08-26 MED ORDER — DEXAMETHASONE SODIUM PHOSPHATE 10 MG/ML IJ SOLN
INTRAMUSCULAR | Status: DC | PRN
  Administered 2018-08-26: 10 mg via INTRAVENOUS

## 2018-08-26 MED ORDER — PHENYLEPHRINE HCL 10 MG/ML IJ SOLN
INTRAMUSCULAR | Status: DC | PRN
  Administered 2018-08-26 (×2): 100 ug via INTRAVENOUS

## 2018-08-26 SURGICAL SUPPLY — 69 items
ANCHOR TIS RET SYS 235ML (MISCELLANEOUS) IMPLANT
APPLIER CLIP 11 MED OPEN (CLIP) ×4
APPLIER CLIP 5 13 M/L LIGAMAX5 (MISCELLANEOUS) ×4
BLADE SURG SZ10 CARB STEEL (BLADE) ×4 IMPLANT
BLADE SURG SZ11 CARB STEEL (BLADE) ×4 IMPLANT
CANISTER SUCT 1200ML W/VALVE (MISCELLANEOUS) ×4 IMPLANT
CHLORAPREP W/TINT 26ML (MISCELLANEOUS) ×4 IMPLANT
CHOLANGIOGRAM CATH TAUT (CATHETERS) IMPLANT
CLIP APPLIE 11 MED OPEN (CLIP) ×2 IMPLANT
CLIP APPLIE 5 13 M/L LIGAMAX5 (MISCELLANEOUS) ×2 IMPLANT
CNTNR SPEC 2.5X3XGRAD LEK (MISCELLANEOUS) ×2
CONT SPEC 4OZ STER OR WHT (MISCELLANEOUS) ×2
CONTAINER SPEC 2.5X3XGRAD LEK (MISCELLANEOUS) ×2 IMPLANT
COVER WAND RF STERILE (DRAPES) IMPLANT
DECANTER SPIKE VIAL GLASS SM (MISCELLANEOUS) IMPLANT
DEFOGGER SCOPE WARMER CLEARIFY (MISCELLANEOUS) ×4 IMPLANT
DERMABOND ADVANCED (GAUZE/BANDAGES/DRESSINGS) ×2
DERMABOND ADVANCED .7 DNX12 (GAUZE/BANDAGES/DRESSINGS) ×2 IMPLANT
DISSECTOR BLUNT TIP ENDO 5MM (MISCELLANEOUS) IMPLANT
DISSECTOR KITTNER STICK (MISCELLANEOUS) ×2 IMPLANT
DISSECTORS/KITTNER STICK (MISCELLANEOUS) ×4
DRAIN CHANNEL JP 15F RND 16 (MISCELLANEOUS) ×4 IMPLANT
DRAPE C-ARM XRAY 36X54 (DRAPES) IMPLANT
DRAPE SHEET LG 3/4 BI-LAMINATE (DRAPES) IMPLANT
ELECT BLADE 6.5 EXT (BLADE) ×4 IMPLANT
ELECT CAUTERY BLADE 6.4 (BLADE) ×4 IMPLANT
ELECT REM PT RETURN 9FT ADLT (ELECTROSURGICAL) ×4
ELECTRODE REM PT RTRN 9FT ADLT (ELECTROSURGICAL) ×2 IMPLANT
GLOVE BIOGEL PI IND STRL 7.0 (GLOVE) ×2 IMPLANT
GLOVE BIOGEL PI INDICATOR 7.0 (GLOVE) ×2
GLOVE SURG SYN 7.0 (GLOVE) ×28 IMPLANT
GOWN STRL REUS W/ TWL LRG LVL3 (GOWN DISPOSABLE) ×4 IMPLANT
GOWN STRL REUS W/TWL LRG LVL3 (GOWN DISPOSABLE) ×4
GRASPER SUT TROCAR 14GX15 (MISCELLANEOUS) ×4 IMPLANT
HANDLE YANKAUER SUCT BULB TIP (MISCELLANEOUS) ×4 IMPLANT
IRRIGATION STRYKERFLOW (MISCELLANEOUS) ×2 IMPLANT
IRRIGATOR STRYKERFLOW (MISCELLANEOUS) ×4
IV CATH ANGIO 12GX3 LT BLUE (NEEDLE) IMPLANT
IV NS 1000ML (IV SOLUTION) ×2
IV NS 1000ML BAXH (IV SOLUTION) ×2 IMPLANT
JACKSON PRATT 10 (INSTRUMENTS) IMPLANT
L-HOOK LAP DISP 36CM (ELECTROSURGICAL) ×4
LHOOK LAP DISP 36CM (ELECTROSURGICAL) ×2 IMPLANT
LIGASURE LAP MARYLAND 5MM 37CM (ELECTROSURGICAL) ×4 IMPLANT
NEEDLE HYPO 22GX1.5 SAFETY (NEEDLE) ×4 IMPLANT
NS IRRIG 1000ML POUR BTL (IV SOLUTION) ×4 IMPLANT
PACK LAP CHOLECYSTECTOMY (MISCELLANEOUS) ×4 IMPLANT
PENCIL ELECTRO HAND CTR (MISCELLANEOUS) ×4 IMPLANT
PORT ACCESS TROCAR AIRSEAL 5 (TROCAR) IMPLANT
SCISSORS METZENBAUM CVD 33 (INSTRUMENTS) ×4 IMPLANT
SET TRI-LUMEN FLTR TB AIRSEAL (TUBING) IMPLANT
SLEEVE ENDOPATH XCEL 5M (ENDOMECHANICALS) ×4 IMPLANT
SPONGE KITTNER 5P (MISCELLANEOUS) ×4 IMPLANT
SPONGE LAP 18X18 RF (DISPOSABLE) ×16 IMPLANT
STAPLER SKIN PROX 35W (STAPLE) ×4 IMPLANT
STOPCOCK 4 WAY LG BORE MALE ST (IV SETS) IMPLANT
SUT MNCRL 4-0 (SUTURE) ×2
SUT MNCRL 4-0 27XMFL (SUTURE) ×2
SUT PDS AB 0 CT1 27 (SUTURE) ×16 IMPLANT
SUT VIC AB 3-0 SH 27 (SUTURE)
SUT VIC AB 3-0 SH 27X BRD (SUTURE) IMPLANT
SUT VICRYL 0 AB UR-6 (SUTURE) ×8 IMPLANT
SUTURE MNCRL 4-0 27XMF (SUTURE) ×2 IMPLANT
SYR 20CC LL (SYRINGE) ×4 IMPLANT
TOWEL OR 17X26 4PK STRL BLUE (TOWEL DISPOSABLE) IMPLANT
TRAY FOLEY MTR SLVR 16FR STAT (SET/KITS/TRAYS/PACK) ×4 IMPLANT
TROCAR XCEL BLUNT TIP 100MML (ENDOMECHANICALS) ×4 IMPLANT
TROCAR XCEL NON-BLD 5MMX100MML (ENDOMECHANICALS) ×12 IMPLANT
WATER STERILE IRR 1000ML POUR (IV SOLUTION) IMPLANT

## 2018-08-26 NOTE — Anesthesia Post-op Follow-up Note (Signed)
Anesthesia QCDR form completed.        

## 2018-08-26 NOTE — Anesthesia Preprocedure Evaluation (Addendum)
Anesthesia Evaluation  Patient identified by MRN, date of birth, ID band Patient awake    Reviewed: Allergy & Precautions, NPO status , Patient's Chart, lab work & pertinent test results  Airway Mallampati: II  TM Distance: >3 FB     Dental  (+) Teeth Intact   Pulmonary neg pulmonary ROS,    Pulmonary exam normal        Cardiovascular negative cardio ROS Normal cardiovascular exam     Neuro/Psych negative neurological ROS  negative psych ROS   GI/Hepatic Neg liver ROS,   Endo/Other  negative endocrine ROS  Renal/GU negative Renal ROS  negative genitourinary   Musculoskeletal negative musculoskeletal ROS (+)   Abdominal Normal abdominal exam  (+)   Peds negative pediatric ROS (+)  Hematology negative hematology ROS (+)   Anesthesia Other Findings History reviewed. No pertinent past medical history.  Reproductive/Obstetrics                             Anesthesia Physical Anesthesia Plan  ASA: II and emergent  Anesthesia Plan: General   Post-op Pain Management:    Induction: Intravenous  PONV Risk Score and Plan:   Airway Management Planned: Oral ETT  Additional Equipment:   Intra-op Plan:   Post-operative Plan: Extubation in OR  Informed Consent: I have reviewed the patients History and Physical, chart, labs and discussed the procedure including the risks, benefits and alternatives for the proposed anesthesia with the patient or authorized representative who has indicated his/her understanding and acceptance.     Dental advisory given  Plan Discussed with: CRNA and Surgeon  Anesthesia Plan Comments:         Anesthesia Quick Evaluation

## 2018-08-26 NOTE — Progress Notes (Signed)
Nausea  zofran given  

## 2018-08-26 NOTE — Anesthesia Procedure Notes (Signed)
Procedure Name: Intubation Date/Time: 08/26/2018 1:51 PM Performed by: Chanetta Marshall, CRNA Pre-anesthesia Checklist: Patient identified, Emergency Drugs available, Suction available and Patient being monitored Patient Re-evaluated:Patient Re-evaluated prior to induction Oxygen Delivery Method: Circle system utilized Preoxygenation: Pre-oxygenation with 100% oxygen Induction Type: IV induction Ventilation: Mask ventilation without difficulty Laryngoscope Size: Mac and 3 Grade View: Grade II Tube size: 7.5 mm Number of attempts: 2 (Grade II view but lost view while passing tube, inadvertently itubated esoph, recognized immideatley, opted to [place] for better view and to assure placement on second attempt. ) Airway Equipment and Method: Stylet and Video-laryngoscopy Placement Confirmation: ETT inserted through vocal cords under direct vision,  positive ETCO2,  CO2 detector and breath sounds checked- equal and bilateral Secured at: 21 cm Tube secured with: Tape Dental Injury: Teeth and Oropharynx as per pre-operative assessment

## 2018-08-26 NOTE — Progress Notes (Signed)
States he feels like he did when gallbladder was in him  Stabbing pain

## 2018-08-26 NOTE — Transfer of Care (Signed)
Immediate Anesthesia Transfer of Care Note  Patient: William Dennis  Procedure(s) Performed: LAPAROSCOPIC CHOLECYSTECTOMY changed to open (N/A )  Patient Location: PACU  Anesthesia Type:General  Level of Consciousness: awake, alert  and oriented  Airway & Oxygen Therapy: Patient Spontanous Breathing  Post-op Assessment: Report given to RN and Post -op Vital signs reviewed and stable  Post vital signs: Reviewed and stable  Last Vitals:  Vitals Value Taken Time  BP 112/74 08/26/2018  6:50 PM  Temp 36.6 C 08/26/2018  6:50 PM  Pulse 113 08/26/2018  6:53 PM  Resp 15 08/26/2018  6:53 PM  SpO2 95 % 08/26/2018  6:53 PM  Vitals shown include unvalidated device data.  Last Pain:  Vitals:   08/26/18 1850  TempSrc:   PainSc: 0-No pain      Patients Stated Pain Goal: 0 (08/26/18 0925)  Complications: No apparent anesthesia complications

## 2018-08-27 DIAGNOSIS — Z5331 Laparoscopic surgical procedure converted to open procedure: Secondary | ICD-10-CM | POA: Diagnosis not present

## 2018-08-27 DIAGNOSIS — Z9049 Acquired absence of other specified parts of digestive tract: Secondary | ICD-10-CM

## 2018-08-27 DIAGNOSIS — R109 Unspecified abdominal pain: Secondary | ICD-10-CM | POA: Diagnosis present

## 2018-08-27 DIAGNOSIS — R9431 Abnormal electrocardiogram [ECG] [EKG]: Secondary | ICD-10-CM | POA: Diagnosis not present

## 2018-08-27 DIAGNOSIS — K838 Other specified diseases of biliary tract: Secondary | ICD-10-CM | POA: Diagnosis not present

## 2018-08-27 DIAGNOSIS — K8000 Calculus of gallbladder with acute cholecystitis without obstruction: Secondary | ICD-10-CM | POA: Diagnosis present

## 2018-08-27 DIAGNOSIS — K660 Peritoneal adhesions (postprocedural) (postinfection): Secondary | ICD-10-CM | POA: Diagnosis present

## 2018-08-27 DIAGNOSIS — Z885 Allergy status to narcotic agent status: Secondary | ICD-10-CM | POA: Diagnosis not present

## 2018-08-27 LAB — BASIC METABOLIC PANEL
Anion gap: 8 (ref 5–15)
BUN: 13 mg/dL (ref 6–20)
CALCIUM: 8.5 mg/dL — AB (ref 8.9–10.3)
CO2: 28 mmol/L (ref 22–32)
Chloride: 103 mmol/L (ref 98–111)
Creatinine, Ser: 0.96 mg/dL (ref 0.61–1.24)
GFR calc Af Amer: >60 mL/min (ref >60)
GFR calc non Af Amer: >60 mL/min (ref >60)
Glucose, Bld: 119 mg/dL — ABNORMAL HIGH (ref 70–99)
Potassium: 4.1 mmol/L (ref 3.5–5.1)
Sodium: 139 mmol/L (ref 135–145)

## 2018-08-27 LAB — CBC
HCT: 45.6 % (ref 39.0–52.0)
HEMOGLOBIN: 14.9 g/dL (ref 13.0–17.0)
MCH: 29.3 pg (ref 26.0–34.0)
MCHC: 32.7 g/dL (ref 30.0–36.0)
MCV: 89.8 fL (ref 80.0–100.0)
Platelets: 258 10*3/uL (ref 150–400)
RBC: 5.08 MIL/uL (ref 4.22–5.81)
RDW: 13 % (ref 11.5–15.5)
WBC: 12.6 10*3/uL — ABNORMAL HIGH (ref 4.0–10.5)
nRBC: 0 % (ref 0.0–0.2)

## 2018-08-27 LAB — HEPATIC FUNCTION PANEL
ALK PHOS: 86 U/L (ref 38–126)
ALT: 133 U/L — ABNORMAL HIGH (ref 0–44)
AST: 105 U/L — ABNORMAL HIGH (ref 15–41)
Albumin: 3.7 g/dL (ref 3.5–5.0)
Bilirubin, Direct: 0.3 mg/dL — ABNORMAL HIGH (ref 0.0–0.2)
Indirect Bilirubin: 0.5 mg/dL (ref 0.3–0.9)
Total Bilirubin: 0.8 mg/dL (ref 0.3–1.2)
Total Protein: 7.1 g/dL (ref 6.5–8.1)

## 2018-08-27 LAB — PHOSPHORUS: Phosphorus: 3.1 mg/dL (ref 2.5–4.6)

## 2018-08-27 LAB — MAGNESIUM: Magnesium: 2 mg/dL (ref 1.7–2.4)

## 2018-08-27 MED ORDER — PANTOPRAZOLE SODIUM 40 MG IV SOLR
40.0000 mg | Freq: Every day | INTRAVENOUS | Status: DC
  Administered 2018-08-27 – 2018-08-29 (×3): 40 mg via INTRAVENOUS
  Filled 2018-08-27 (×3): qty 40

## 2018-08-27 MED ORDER — GUAIFENESIN-DM 100-10 MG/5ML PO SYRP
5.0000 mL | ORAL_SOLUTION | ORAL | Status: DC | PRN
  Administered 2018-08-28: 5 mL via ORAL
  Filled 2018-08-27: qty 5

## 2018-08-27 MED ORDER — HYDROMORPHONE HCL 1 MG/ML IJ SOLN
0.5000 mg | Freq: Once | INTRAMUSCULAR | Status: AC
  Administered 2018-08-27: 0.5 mg via INTRAVENOUS
  Filled 2018-08-27: qty 0.5

## 2018-08-27 NOTE — Progress Notes (Signed)
GI courtesy note.  Came to check on patient 3 times today to perform consultation, but he appears to be in the bathroom each time and not accessible. The case for consulttion was discussed at length with Dr. Tonna Boehringer the surgeon.  The chart was reviewed. Patient has a presumptive bile leak after open cholecystectomy that requires bliary diversion of bile with a stent.  Case discussed also with Dr. Servando Snare who is available and agreeable to perform ERCP tomorrow.  Formal consult still pending, but will write orders in preparation for ERCP. Further recommendations/notes by Dr. Servando Snare.  Call in the interim if I can help. Thank you.   George Ina, M.D. Gastroenterology, Board Certified [facility] [phone]

## 2018-08-27 NOTE — Anesthesia Postprocedure Evaluation (Signed)
Anesthesia Post Note  Patient: William Dennis  Procedure(s) Performed: LAPAROSCOPIC CHOLECYSTECTOMY changed to open (N/A )  Patient location during evaluation: PACU Anesthesia Type: General Level of consciousness: awake and alert and oriented Pain management: pain level controlled Vital Signs Assessment: post-procedure vital signs reviewed and stable Respiratory status: spontaneous breathing Cardiovascular status: blood pressure returned to baseline Anesthetic complications: no     Last Vitals:  Vitals:   08/26/18 2053 08/26/18 2301  BP: 112/67 104/71  Pulse: 95 95  Resp: 20 16  Temp: 36.4 C 37.1 C  SpO2: 95% 94%    Last Pain:  Vitals:   08/27/18 0150  TempSrc:   PainSc: Asleep                 Patrik Turnbaugh

## 2018-08-27 NOTE — Progress Notes (Signed)
Subjective:  CC:  William Dennis is a 33 y.o. male  Hospital stay day 0, 1 Day Post-Op lap converted to open chole  HPI: No acute issues overnight.  ROS:  General: Denies weight loss, weight gain, fatigue, fevers, chills, and night sweats. Heart: Denies chest pain, palpitations, racing heart, irregular heartbeat, leg pain or swelling, and decreased activity tolerance. Respiratory: Denies breathing difficulty, shortness of breath, wheezing, cough, and sputum. GI: Denies change in appetite, heartburn, nausea, vomiting, constipation, diarrhea, and blood in stool. GU: Denies difficulty urinating, pain with urinating, urgency, frequency, blood in urine.   Objective:      Temp:  [97.5 F (36.4 C)-98.9 F (37.2 C)] 98 F (36.7 C) (02/16 1211) Pulse Rate:  [85-107] 95 (02/16 1211) Resp:  [11-22] 20 (02/16 0447) BP: (104-123)/(67-81) 112/67 (02/16 1211) SpO2:  [93 %-98 %] 98 % (02/16 1211)     Height: 6' (182.9 cm) Weight: 101.1 kg BMI (Calculated): 30.22   Intake/Output this shift:   Intake/Output Summary (Last 24 hours) at 08/27/2018 1304 Last data filed at 08/27/2018 1150 Gross per 24 hour  Intake 1300 ml  Output 1580 ml  Net -280 ml        Constitutional :  alert, cooperative, appears stated age and no distress  Respiratory:  clear to auscultation bilaterally  Cardiovascular:  regular rate and rhythm  Gastrointestinal: dressings intact, drain with bilious drainage now as expected.   Skin: Cool and moist.   Psychiatric: Normal affect, non-agitated, not confused       LABS:  CMP Latest Ref Rng & Units 08/27/2018 08/26/2018 08/25/2018  Glucose 70 - 99 mg/dL 437(D) 91 99  BUN 6 - 20 mg/dL 13 11 12   Creatinine 0.61 - 1.24 mg/dL 5.78 9.78 4.78  Sodium 135 - 145 mmol/L 139 137 138  Potassium 3.5 - 5.1 mmol/L 4.1 3.7 3.8  Chloride 98 - 111 mmol/L 103 101 102  CO2 22 - 32 mmol/L 28 29 29   Calcium 8.9 - 10.3 mg/dL 4.1(Q) 9.1 9.7  Total Protein 6.5 - 8.1 g/dL 7.1 8.0 8.2(K)   Total Bilirubin 0.3 - 1.2 mg/dL 0.8 8.1(N) 1.2  Alkaline Phos 38 - 126 U/L 86 90 75  AST 15 - 41 U/L 105(H) 50(H) 39  ALT 0 - 44 U/L 133(H) 86(H) 80(H)   CBC Latest Ref Rng & Units 08/27/2018 08/26/2018 08/25/2018  WBC 4.0 - 10.5 K/uL 12.6(H) 9.6 10.3  Hemoglobin 13.0 - 17.0 g/dL 88.7 19.5 97.4  Hematocrit 39.0 - 52.0 % 45.6 48.8 47.1  Platelets 150 - 400 K/uL 258 242 253    RADS: n/a Assessment:   S/p lap converted to open chole with bile leak noted.  GI consulted and tenatively scheduled for ERCP tomorrow.  Will continue supportive care till then

## 2018-08-27 NOTE — Op Note (Signed)
Preoperative diagnosis:  acute and cholecystitis  Postoperative diagnosis: same as above  Procedure: Laparoscopic converted to open subtotal Cholecystectomy.   Anesthesia: GETA   Surgeon: Sung Amabile Assistant: Hulda Marin  Specimen: Gallbladder  Complications: None  EBL:  Wound Classification: Clean Contaminated  Indications: see HPI  Findings:  Extremely thick peritoneal gallbladder lining, or extremely thin gallbladder serosa, with large gallstone stuck at the neck.  Unable to visualize cystic duct secondary to chronic inflammation Cystic artery noted and clipped.  Description of procedure: The patient was placed on the operating table in the supine position. SCDs placed, pre-op abx administered.  General anesthesia was induced and OG tube placed by anesthesia. A time-out was completed verifying correct patient, procedure, site, positioning, and implant(s) and/or special equipment prior to beginning this procedure. The abdomen was prepped and draped in the usual sterile fashion.  An incision was made in a natural skin line under the umbilicus.  Dissection carried down to fascia where two 0 vicryl sutures placed to use as anchor sutures for hasson port.  Incision made into fascia and blunt dissection used to enter peritoneum.  Hasson port placed and insufflation started up to 17mm Hg without any dramatic increase in pressure.    The laparoscope was inserted and the abdomen inspected. No injuries from initial trocar placement were noted.  Large distended transverse colon made it difficult to visualize the gallbladder initially.  Additional trocars were then inserted under direct visualization in the following locations: a 5-mm trocar in the subxyphoid region and two 5-mm trocars along the right costal margin. The abdomen was inspected and no abnormalities or injuries were found. The table was placed in the reverse Trendelenburg position with the right side up.  Filmy adhesions  between the gallbladder and omentum, duodenum and transverse colon were lysed sharply. The dome of the gallbladder was grasped with an atraumatic grasper passed through the lateral port and retracted over the dome of the liver.  Gallbladder was initially drained through needle decompression secondary to the extremely thick peritoneal lining, aching it difficult to maintain traction on the gallbladder itself.  After adequate decompression, the infundibulum was grasped with an atraumatic grasper and retracted toward the right lower quadrant.  Despite these maneuvers, the transverse colon was still within the field-of-view obstructing the work area.  An additional 5 mm port was placed within the subcostal region and a Kitner was used to retract the colon out of the field-of-view.  This maneuver exposed Calot's triangle.   The peritoneum overlying the gallbladder infundibulum was meticulously dissected to try to obtain critical view of safety.  Despite over 2 hours of careful dissection, the critical view of safety was not able to be achieved.  The cystic artery was able to be isolated and clipped and ligated during this process process.  However the cystic duct like structure was encased and extremely thick peritoneal lining along with dense inflammatory tissue.  The gallbladder itself by this point had multiple holes in it due to the inability to find a proper plane between the peritoneal lining and the serosa.  Top-down approach to further attempt identification of the cystic duct was unsuccessful laparoscopically.  Decision was made at this point to convert to an open cholecystectomy to see if we can proceed with a top-down approach.  Subcostal port sites were connected into 1 larger Coker incision, and dissection carried down until abdominal cavity was entered.  Bookwalter retractors were then used to obtain the field-of-view.  Despite multiple attempts  at retraction, body positioning, the view continued to be  not as ideal as it could be.  Dissection was again attempted via a top-down approach, assistance of Dr. Thelma Barge at this point.  This dissection also proved to be unsuccessful, and the cystic duct was not able to be isolated.  Further dissection beyond the point of work would have likely caused injury to the common bile duct, vision was made at this point to abort the procedure.  During the dissection in order to obtain better field-of-view, the gallbladder body and infundibulum were transected multiple times and gallbladder contents removed entirely.  This also did not prove to be successful.  Area was irrigated and all fluid including leak bile was suctioned out completely.  A Blake drain was placed over the open cystic duct and Surgicel was placed along the friable liver bed.  Hemostasis is achieved with manual pressure.  Coker incision then closed in a 2 layer fashion using 0 PDS running sutures for the deep and superficial fascia.  Exparel was infused along the entire incision site as well as at the former umbilical port site prior to closure.   The end of the [facility] drain that was placed through the far lateral 5 mm port site was then secured to the skin using 3-0 nylon sutures.  The fascia at the umbilical port site was closed with the stay sutures.   Staples were then used to approximate the overlying skin at the Kocher incision, and umbilical port.  The orogastric tube was removed and patient extubated.  Foley catheter was placed afterwards due to the unexpected prolonged procedure.  The patient tolerated the procedure well and was taken to the postanesthesia care unit in stable condition.  In order to expedite the conversion to an open procedure initial instrument counts were not performed.  Therefore post operative x-ray was taken and confirmed that no foreign retained objects were noted.

## 2018-08-28 ENCOUNTER — Encounter
Admission: EM | Disposition: A | Discharge status: Home / Self Care | Payer: Self-pay | Source: Home / Self Care | Attending: Surgery

## 2018-08-28 ENCOUNTER — Inpatient Hospital Stay: Payer: BLUE CROSS/BLUE SHIELD

## 2018-08-28 ENCOUNTER — Inpatient Hospital Stay: Payer: BLUE CROSS/BLUE SHIELD | Admitting: Anesthesiology

## 2018-08-28 ENCOUNTER — Encounter: Payer: Self-pay | Admitting: Surgery

## 2018-08-28 DIAGNOSIS — R9431 Abnormal electrocardiogram [ECG] [EKG]: Secondary | ICD-10-CM

## 2018-08-28 DIAGNOSIS — K838 Other specified diseases of biliary tract: Secondary | ICD-10-CM

## 2018-08-28 HISTORY — PX: ERCP: SHX5425

## 2018-08-28 LAB — BASIC METABOLIC PANEL
Anion gap: 8 (ref 5–15)
BUN: 12 mg/dL (ref 6–20)
CALCIUM: 8.5 mg/dL — AB (ref 8.9–10.3)
CO2: 28 mmol/L (ref 22–32)
Chloride: 102 mmol/L (ref 98–111)
Creatinine, Ser: 0.79 mg/dL (ref 0.61–1.24)
GFR calc Af Amer: >60 mL/min (ref >60)
GFR calc non Af Amer: >60 mL/min (ref >60)
Glucose, Bld: 98 mg/dL (ref 70–99)
Potassium: 3.9 mmol/L (ref 3.5–5.1)
Sodium: 138 mmol/L (ref 135–145)

## 2018-08-28 LAB — HEPATIC FUNCTION PANEL
ALT: 104 U/L — ABNORMAL HIGH (ref 0–44)
AST: 64 U/L — ABNORMAL HIGH (ref 15–41)
Albumin: 3.5 g/dL (ref 3.5–5.0)
Alkaline Phosphatase: 78 U/L (ref 38–126)
Bilirubin, Direct: 0.2 mg/dL (ref 0.0–0.2)
Indirect Bilirubin: 0.8 mg/dL (ref 0.3–0.9)
Total Bilirubin: 1 mg/dL (ref 0.3–1.2)
Total Protein: 6.7 g/dL (ref 6.5–8.1)

## 2018-08-28 LAB — CBC
HCT: 40.8 % (ref 39.0–52.0)
Hemoglobin: 13.2 g/dL (ref 13.0–17.0)
MCH: 28.9 pg (ref 26.0–34.0)
MCHC: 32.4 g/dL (ref 30.0–36.0)
MCV: 89.5 fL (ref 80.0–100.0)
Platelets: 203 10*3/uL (ref 150–400)
RBC: 4.56 MIL/uL (ref 4.22–5.81)
RDW: 12.9 % (ref 11.5–15.5)
WBC: 10.6 10*3/uL — ABNORMAL HIGH (ref 4.0–10.5)
nRBC: 0 % (ref 0.0–0.2)

## 2018-08-28 LAB — HIV ANTIBODY (ROUTINE TESTING W REFLEX): HIV Screen 4th Generation wRfx: NONREACTIVE

## 2018-08-28 LAB — PHOSPHORUS: Phosphorus: 2.2 mg/dL — ABNORMAL LOW (ref 2.5–4.6)

## 2018-08-28 LAB — MAGNESIUM: Magnesium: 2 mg/dL (ref 1.7–2.4)

## 2018-08-28 SURGERY — ERCP, WITH INTERVENTION IF INDICATED
Anesthesia: General

## 2018-08-28 MED ORDER — ONDANSETRON HCL 4 MG/2ML IJ SOLN
INTRAMUSCULAR | Status: DC | PRN
  Administered 2018-08-28: 4 mg via INTRAVENOUS

## 2018-08-28 MED ORDER — MIDAZOLAM HCL 2 MG/2ML IJ SOLN
INTRAMUSCULAR | Status: DC | PRN
  Administered 2018-08-28: 2 mg via INTRAVENOUS

## 2018-08-28 MED ORDER — PROMETHAZINE HCL 25 MG/ML IJ SOLN
6.2500 mg | INTRAMUSCULAR | Status: DC | PRN
  Administered 2018-08-28: 6.25 mg via INTRAVENOUS

## 2018-08-28 MED ORDER — FENTANYL CITRATE (PF) 100 MCG/2ML IJ SOLN
INTRAMUSCULAR | Status: DC | PRN
  Administered 2018-08-28: 100 ug via INTRAVENOUS

## 2018-08-28 MED ORDER — PROPOFOL 10 MG/ML IV BOLUS
INTRAVENOUS | Status: DC | PRN
  Administered 2018-08-28: 200 mg via INTRAVENOUS

## 2018-08-28 MED ORDER — INDOMETHACIN 50 MG RE SUPP
RECTAL | Status: AC
  Administered 2018-08-28: 100 mg via RECTAL
  Filled 2018-08-28: qty 2

## 2018-08-28 MED ORDER — ACETAMINOPHEN 160 MG/5ML PO SOLN
325.0000 mg | ORAL | Status: DC | PRN
  Filled 2018-08-28: qty 20.3

## 2018-08-28 MED ORDER — MEPERIDINE HCL 25 MG/ML IJ SOLN
6.2500 mg | INTRAMUSCULAR | Status: DC | PRN
  Filled 2018-08-28: qty 1

## 2018-08-28 MED ORDER — SODIUM CHLORIDE FLUSH 0.9 % IV SOLN
INTRAVENOUS | Status: AC
  Filled 2018-08-28: qty 10

## 2018-08-28 MED ORDER — SUCCINYLCHOLINE CHLORIDE 20 MG/ML IJ SOLN
INTRAMUSCULAR | Status: DC | PRN
  Administered 2018-08-28: 140 mg via INTRAVENOUS

## 2018-08-28 MED ORDER — PROPOFOL 500 MG/50ML IV EMUL
INTRAVENOUS | Status: DC | PRN
  Administered 2018-08-28: 150 ug/kg/min via INTRAVENOUS

## 2018-08-28 MED ORDER — FENTANYL CITRATE (PF) 100 MCG/2ML IJ SOLN
INTRAMUSCULAR | Status: AC
  Filled 2018-08-28: qty 2

## 2018-08-28 MED ORDER — INDOMETHACIN 50 MG RE SUPP
100.0000 mg | Freq: Once | RECTAL | Status: AC
  Administered 2018-08-28: 100 mg via RECTAL

## 2018-08-28 MED ORDER — FENTANYL CITRATE (PF) 100 MCG/2ML IJ SOLN: 25.0000 ug | INTRAMUSCULAR | Status: DC | PRN

## 2018-08-28 MED ORDER — PROPOFOL 10 MG/ML IV BOLUS
INTRAVENOUS | Status: AC
  Filled 2018-08-28: qty 20

## 2018-08-28 MED ORDER — PROMETHAZINE HCL 25 MG/ML IJ SOLN
INTRAMUSCULAR | Status: AC
  Administered 2018-08-28: 6.25 mg via INTRAVENOUS
  Filled 2018-08-28: qty 1

## 2018-08-28 MED ORDER — ACETAMINOPHEN 325 MG PO TABS: 325.0000 mg | ORAL_TABLET | ORAL | Status: DC | PRN

## 2018-08-28 MED ORDER — MIDAZOLAM HCL 2 MG/2ML IJ SOLN
INTRAMUSCULAR | Status: AC
  Filled 2018-08-28: qty 2

## 2018-08-28 NOTE — Anesthesia Postprocedure Evaluation (Signed)
Anesthesia Post Note  Patient: William Dennis  Procedure(s) Performed: ENDOSCOPIC RETROGRADE CHOLANGIOPANCREATOGRAPHY (ERCP) (N/A )  Patient location during evaluation: PACU Anesthesia Type: General Level of consciousness: awake and alert Pain management: pain level controlled Vital Signs Assessment: post-procedure vital signs reviewed and stable Respiratory status: spontaneous breathing, nonlabored ventilation and respiratory function stable Cardiovascular status: blood pressure returned to baseline and stable Postop Assessment: no apparent nausea or vomiting Anesthetic complications: no     Last Vitals:  Vitals:   08/28/18 1315 08/28/18 1340  BP:  126/83  Pulse: 74 64  Resp: 14 20  Temp: (!) 36.4 C 36.5 C  SpO2: 99% 99%    Last Pain:  Vitals:   08/28/18 1340  TempSrc: Oral  PainSc:                  [name]

## 2018-08-28 NOTE — Transfer of Care (Signed)
Immediate Anesthesia Transfer of Care Note  Patient: William Dennis  Procedure(s) Performed: ENDOSCOPIC RETROGRADE CHOLANGIOPANCREATOGRAPHY (ERCP) (N/A )  Patient Location: PACU  Anesthesia Type:General  Level of Consciousness: sedated  Airway & Oxygen Therapy: Patient Spontanous Breathing  Post-op Assessment: Report given to RN  Post vital signs: stable  Last Vitals:  Vitals Value Taken Time  BP 124/87 08/28/2018 12:41 PM  Temp    Pulse 87 08/28/2018 12:43 PM  Resp 14 08/28/2018 12:43 PM  SpO2 98 % 08/28/2018 12:43 PM  Vitals shown include unvalidated device data.  Last Pain:  Vitals:   08/28/18 1241  TempSrc:   PainSc: (P) Asleep      Patients Stated Pain Goal: 0 (08/28/18 0746)  Complications: No apparent anesthesia complications

## 2018-08-28 NOTE — Anesthesia Procedure Notes (Signed)
Procedure Name: Intubation Performed by: Alphonsus Sias, MD Pre-anesthesia Checklist: Patient identified, Emergency Drugs available, Suction available, Patient being monitored and Timeout performed Patient Re-evaluated:Patient Re-evaluated prior to induction Oxygen Delivery Method: Circle system utilized Preoxygenation: Pre-oxygenation with 100% oxygen Induction Type: IV induction Ventilation: Mask ventilation without difficulty Laryngoscope Size: Mac and 3 Grade View: Grade I Tube type: Oral Number of attempts: 1 Placement Confirmation: ETT inserted through vocal cords under direct vision,  positive ETCO2 and breath sounds checked- equal and bilateral Secured at: 21 cm Tube secured with: Tape Dental Injury: Teeth and Oropharynx as per pre-operative assessment

## 2018-08-28 NOTE — Op Note (Signed)
[facility] Gastroenterology Patient Name: William BrookingCharles Dennis Procedure Date: 08/28/2018 11:31 AM MRN: [id number] Account #: [id number] Date of Birth: 05-27-1986 Admit Type: Inpatient Age: [age] Room: [facility] ROOM 4 Gender: Male Note Status: Finalized Procedure:            ERCP Indications:          Bile leak Providers:            Midge Miniumarren Catalina Salasar MD, MD Referring MD:         No Local Md, MD (Referring MD) Medicines:            General Anesthesia Complications:        No immediate complications. Procedure:            Pre-Anesthesia Assessment:                       - Prior to the procedure, a History and Physical was                        performed, and patient medications and allergies were                        reviewed. The patient's tolerance of previous                        anesthesia was also reviewed. The risks and benefits of                        the procedure and the sedation options and risks were                        discussed with the patient. All questions were                        answered, and informed consent was obtained. Prior                        Anticoagulants: The patient has taken no previous                        anticoagulant or antiplatelet agents. ASA Grade                        Assessment: II - A patient with mild systemic disease.                        After reviewing the risks and benefits, the patient was                        deemed in satisfactory condition to undergo the                        procedure.                       After obtaining informed consent, the scope was passed                        under direct vision. Throughout the procedure, the  patient's blood pressure, pulse, and oxygen saturations                        were monitored continuously. The Duodenoscope was                        introduced through the mouth, and used to inject                        contrast into and used to  inject contrast into the bile                        duct. The ERCP was accomplished without difficulty. The                        patient tolerated the procedure well. Findings:      A scout film of the abdomen was obtained. One percutaneous drain ending       in the Right upper quadrant was seen. The esophagus was successfully       intubated under direct vision. The scope was advanced to a normal major       papilla in the descending duodenum without detailed examination of the       pharynx, larynx and associated structures, and upper GI tract. The upper       GI tract was grossly normal. The bile duct was deeply cannulated with       the short-nosed traction sphincterotome. Contrast was injected. I       personally interpreted the bile duct images. There was brisk flow of       contrast through the ducts. Image quality was excellent. Contrast       extended to the entire biliary tree. A wire was passed into the biliary       tree. A 5 mm biliary sphincterotomy was made with a traction (standard)       sphincterotome using ERBE electrocautery. There was no       post-sphincterotomy bleeding. One 10 Fr by 9 cm plastic stent with a       single external flap and a single internal flap was placed 7 cm into the       common bile duct. Bile flowed through the stent. The stent was in good       position. Impression:           - A biliary sphincterotomy was performed.                       - One plastic stent was placed into the common bile                        duct. Recommendation:       - Return patient to hospital ward for ongoing care.                       - Clear liquid diet today.                       - Continue present medications. Procedure Code(s):    --- Professional ---                       [phone], Endoscopic retrograde cholangiopancreatography                        (  ERCP); with placement of endoscopic stent into biliary                        or pancreatic duct, including  pre- and post-dilation                        and guide wire passage, when performed, including                        sphincterotomy, when performed, each stent                       [id number], Endoscopic catheterization of the biliary ductal                        system, radiological supervision and interpretation Diagnosis Code(s):    --- Professional ---                       K83.8, Other specified diseases of biliary tract CPT copyright 2018 American Medical Association. All rights reserved. The codes documented in this report are preliminary and upon coder review may  be revised to meet current compliance requirements. Midge Minium MD, MD 08/28/2018 12:22:46 PM This report has been signed electronically. Number of Addenda: 0 Note Initiated On: 08/28/2018 11:31 AM      [facility]

## 2018-08-28 NOTE — Anesthesia Post-op Follow-up Note (Signed)
Anesthesia QCDR form completed.        

## 2018-08-28 NOTE — Progress Notes (Signed)
Per MD okay for RN to start pt in clear liquids.

## 2018-08-28 NOTE — OR Nursing (Signed)
infomored Dr. Providence Lanius of EKG results.

## 2018-08-28 NOTE — Anesthesia Preprocedure Evaluation (Signed)
Anesthesia Evaluation  Patient identified by MRN, date of birth, ID band Patient awake    Reviewed: Allergy & Precautions, H&P , NPO status , reviewed documented beta blocker date and time   Airway Mallampati: II  TM Distance: >3 FB Neck ROM: full    Dental  (+) Teeth Intact   Pulmonary    Pulmonary exam normal        Cardiovascular Normal cardiovascular exam     Neuro/Psych    GI/Hepatic   Endo/Other    Renal/GU      Musculoskeletal   Abdominal   Peds  Hematology   Anesthesia Other Findings History reviewed. No pertinent past medical history. Past Surgical History: 08/26/2018: CHOLECYSTECTOMY; N/A     Comment:  Procedure: LAPAROSCOPIC CHOLECYSTECTOMY changed to open;              Surgeon: Sung Amabile, DO;  Location: [facility] ORS;  Service:              General;  Laterality: N/A; 08/26/2018: CHOLECYSTECTOMY     Comment:  Procedure: CHOLECYSTECTOMY;  Surgeon: Sung Amabile, DO;               Location: [facility] ORS;  Service: General;; BMI    Body Mass Index:  30.24 kg/m     Reproductive/Obstetrics                             Anesthesia Physical Anesthesia Plan  ASA: II  Anesthesia Plan: General   Post-op Pain Management:    Induction: Intravenous  PONV Risk Score and Plan: 2 and Ondansetron, Treatment may vary due to age or medical condition, Midazolam and TIVA  Airway Management Planned: Oral ETT  Additional Equipment:   Intra-op Plan:   Post-operative Plan: Extubation in OR  Informed Consent: I have reviewed the patients History and Physical, chart, labs and discussed the procedure including the risks, benefits and alternatives for the proposed anesthesia with the patient or authorized representative who has indicated his/her understanding and acceptance.     Dental Advisory Given  Plan Discussed with:   Anesthesia Plan Comments:         Anesthesia Quick  Evaluation

## 2018-08-28 NOTE — Progress Notes (Signed)
   Melodie Bouillon, MD [address], [place], [place], [address] [address], [place], [place], [address] Phone: [phone]  Fax: [phone]   Subjective:  Patient is status post ERCP today, and denies any abdominal pain post procedure.  No nausea or vomiting.  Objective: Exam: Vital signs in last 24 hours: Vitals:   08/28/18 1254 08/28/18 1303 08/28/18 1315 08/28/18 1340  BP:    126/83  Pulse: 89 83 74 64  Resp: 13 17 14 20   Temp:   (!) 97.5 F (36.4 C) 97.7 F (36.5 C)  TempSrc:    Oral  SpO2: 97% 98% 99% 99%  Weight:      Height:       Weight change:   Intake/Output Summary (Last 24 hours) at 08/28/2018 1812 Last data filed at 08/28/2018 1518 Gross per 24 hour  Intake 1202.64 ml  Output 131 ml  Net 1071.64 ml    General: No acute distress, AAO x3 Abd: Soft, NT/ND, No HSM Skin: Warm, no rashes Neck: Supple, Trachea midline   Lab Results: Lab Results  Component Value Date   WBC 10.6 (H) 08/28/2018   HGB 13.2 08/28/2018   HCT 40.8 08/28/2018   MCV 89.5 08/28/2018   PLT 203 08/28/2018   Micro Results: No results found for this or any previous visit (from the past 240 hour(s)). Studies/Results: Dg Abd Portable 1v  Result Date: 08/26/2018 CLINICAL DATA:  Intraoperative sponge count EXAM: PORTABLE ABDOMEN - 1 VIEW COMPARISON:  None. FINDINGS: Surgical drain RIGHT upper quadrant. Skin staples noted. Cholecystectomy clips noted. No radiodense retained foreign body. IMPRESSION: No radiodense retained foreign body. Electronically Signed   By: Genevive Bi M.D.   On: 08/26/2018 18:47   Dg C-arm 1-60 Min-no Report  Result Date: 08/28/2018 Fluoroscopy was utilized by the requesting physician.  No radiographic interpretation.   Medications:  Scheduled Meds: . pantoprazole (PROTONIX) IV  40 mg Intravenous QHS  . polyethylene glycol  17 g Oral Daily  . sodium chloride flush       Continuous Infusions: . cefTRIAXone (ROCEPHIN)  IV  2 g (08/28/18 1745)  . lactated ringers 75 mL/hr at 08/28/18 1518   PRN Meds:.acetaminophen **OR** acetaminophen, docusate sodium, guaiFENesin-dextromethorphan, HYDROmorphone (DILAUDID) injection, ondansetron **OR** ondansetron (ZOFRAN) IV   Assessment: Active Problems:   Acute cholecystitis   S/P cholecystectomy   Common bile duct leak    Plan: Patient doing well post ERCP Continue to monitor abdominal symptoms post procedure No signs of pancreatitis at this time    LOS: 1 day   Melodie Bouillon, MD 08/28/2018, 6:12 PM

## 2018-08-28 NOTE — Progress Notes (Signed)
Subjective:  CC:  William Dennis is a 33 y.o. male  Hospital stay day 1, Day of Surgery lap converted to open chole  HPI: No acute issues overnight.  ERCP stent placed.  ROS:  General: Denies weight loss, weight gain, fatigue, fevers, chills, and night sweats. Heart: Denies chest pain, palpitations, racing heart, irregular heartbeat, leg pain or swelling, and decreased activity tolerance. Respiratory: Denies breathing difficulty, shortness of breath, wheezing, cough, and sputum. GI: Denies change in appetite, heartburn, nausea, vomiting, constipation, diarrhea, and blood in stool. GU: Denies difficulty urinating, pain with urinating, urgency, frequency, blood in urine.   Objective:      Temp:  [97.1 F (36.2 C)-99.3 F (37.4 C)] 97.7 F (36.5 C) (02/17 1340) Pulse Rate:  [64-98] 64 (02/17 1340) Resp:  [11-20] 20 (02/17 1340) BP: (108-127)/(73-87) 126/83 (02/17 1340) SpO2:  [96 %-99 %] 99 % (02/17 1340) Weight:  [101.2 kg] 101.2 kg (02/17 1055)     Height: 6' (182.9 cm) Weight: 101.2 kg BMI (Calculated): 30.24   Intake/Output this shift:   Intake/Output Summary (Last 24 hours) at 08/28/2018 1755 Last data filed at 08/28/2018 1518 Gross per 24 hour  Intake 1202.64 ml  Output 131 ml  Net 1071.64 ml        Constitutional :  alert, cooperative, appears stated age and no distress  Respiratory:  clear to auscultation bilaterally  Cardiovascular:  regular rate and rhythm  Gastrointestinal: dressings intact, drain with bilious drainage.   Skin: Cool and moist.   Psychiatric: Normal affect, non-agitated, not confused       LABS:  CMP Latest Ref Rng & Units 08/28/2018 08/27/2018 08/26/2018  Glucose 70 - 99 mg/dL 98 709(U) 91  BUN 6 - 20 mg/dL 12 13 11   Creatinine 0.61 - 1.24 mg/dL 4.38 3.81 8.40  Sodium 135 - 145 mmol/L 138 139 137  Potassium 3.5 - 5.1 mmol/L 3.9 4.1 3.7  Chloride 98 - 111 mmol/L 102 103 101  CO2 22 - 32 mmol/L 28 28 29   Calcium 8.9 - 10.3 mg/dL 3.7(V)  4.3(K) 9.1  Total Protein 6.5 - 8.1 g/dL 6.7 7.1 8.0  Total Bilirubin 0.3 - 1.2 mg/dL 1.0 0.8 0.6(V)  Alkaline Phos 38 - 126 U/L 78 86 90  AST 15 - 41 U/L 64(H) 105(H) 50(H)  ALT 0 - 44 U/L 104(H) 133(H) 86(H)   CBC Latest Ref Rng & Units 08/28/2018 08/27/2018 08/26/2018  WBC 4.0 - 10.5 K/uL 10.6(H) 12.6(H) 9.6  Hemoglobin 13.0 - 17.0 g/dL 70.3 40.3 52.4  Hematocrit 39.0 - 52.0 % 40.8 45.6 48.8  Platelets 150 - 400 K/uL 203 258 242    RADS: n/a Assessment:   S/p lap converted to open chole with bile leak noted.  GI consulted stent placement via ERCP successful.  Restart clears and monitor for increased pain, bile output.

## 2018-08-29 ENCOUNTER — Encounter: Payer: Self-pay | Admitting: Gastroenterology

## 2018-08-29 LAB — HEPATIC FUNCTION PANEL
ALT: 86 U/L — ABNORMAL HIGH (ref 0–44)
AST: 46 U/L — ABNORMAL HIGH (ref 15–41)
Albumin: 3.2 g/dL — ABNORMAL LOW (ref 3.5–5.0)
Alkaline Phosphatase: 75 U/L (ref 38–126)
Bilirubin, Direct: 0.1 mg/dL (ref 0.0–0.2)
Indirect Bilirubin: 0.7 mg/dL (ref 0.3–0.9)
TOTAL PROTEIN: 6.4 g/dL — AB (ref 6.5–8.1)
Total Bilirubin: 0.8 mg/dL (ref 0.3–1.2)

## 2018-08-29 LAB — CBC
HCT: 37.8 % — ABNORMAL LOW (ref 39.0–52.0)
Hemoglobin: 12.4 g/dL — ABNORMAL LOW (ref 13.0–17.0)
MCH: 29.2 pg (ref 26.0–34.0)
MCHC: 32.8 g/dL (ref 30.0–36.0)
MCV: 89.2 fL (ref 80.0–100.0)
Platelets: 204 10*3/uL (ref 150–400)
RBC: 4.24 MIL/uL (ref 4.22–5.81)
RDW: 12.7 % (ref 11.5–15.5)
WBC: 7.3 10*3/uL (ref 4.0–10.5)
nRBC: 0 % (ref 0.0–0.2)

## 2018-08-29 LAB — BASIC METABOLIC PANEL
Anion gap: 4 — ABNORMAL LOW (ref 5–15)
BUN: 10 mg/dL (ref 6–20)
CHLORIDE: 107 mmol/L (ref 98–111)
CO2: 28 mmol/L (ref 22–32)
CREATININE: 0.9 mg/dL (ref 0.61–1.24)
Calcium: 8.2 mg/dL — ABNORMAL LOW (ref 8.9–10.3)
GFR calc Af Amer: >60 mL/min (ref >60)
GFR calc non Af Amer: >60 mL/min (ref >60)
Glucose, Bld: 102 mg/dL — ABNORMAL HIGH (ref 70–99)
Potassium: 3.6 mmol/L (ref 3.5–5.1)
Sodium: 139 mmol/L (ref 135–145)

## 2018-08-29 LAB — MAGNESIUM: Magnesium: 1.9 mg/dL (ref 1.7–2.4)

## 2018-08-29 LAB — SURGICAL PATHOLOGY

## 2018-08-29 LAB — HEMOGLOBIN AND HEMATOCRIT, BLOOD
HCT: 39.8 % (ref 39.0–52.0)
HEMOGLOBIN: 13 g/dL (ref 13.0–17.0)

## 2018-08-29 LAB — PHOSPHORUS: PHOSPHORUS: 2.2 mg/dL — AB (ref 2.5–4.6)

## 2018-08-29 MED ORDER — CELECOXIB 200 MG PO CAPS
200.0000 mg | ORAL_CAPSULE | Freq: Every day | ORAL | Status: DC
  Administered 2018-08-29 – 2018-08-30 (×2): 200 mg via ORAL
  Filled 2018-08-29 (×2): qty 1

## 2018-08-29 MED ORDER — CIPROFLOXACIN HCL 500 MG PO TABS
500.0000 mg | ORAL_TABLET | Freq: Two times a day (BID) | ORAL | Status: DC
  Administered 2018-08-29 – 2018-08-30 (×3): 500 mg via ORAL
  Filled 2018-08-29 (×3): qty 1

## 2018-08-29 MED ORDER — METRONIDAZOLE 500 MG PO TABS
500.0000 mg | ORAL_TABLET | Freq: Three times a day (TID) | ORAL | Status: DC
  Administered 2018-08-29 – 2018-08-30 (×4): 500 mg via ORAL
  Filled 2018-08-29 (×5): qty 1

## 2018-08-29 MED ORDER — GABAPENTIN 100 MG PO CAPS
200.0000 mg | ORAL_CAPSULE | Freq: Two times a day (BID) | ORAL | Status: DC
  Administered 2018-08-29 – 2018-08-30 (×3): 200 mg via ORAL
  Filled 2018-08-29 (×4): qty 2

## 2018-08-29 NOTE — Progress Notes (Signed)
Subjective:  CC:  William Dennis is a 33 y.o. male  Hospital stay day 2, 1 Day Post-Op lap converted to open chole  HPI: No acute issues overnight.  Tolerated clears  ROS:  General: Denies weight loss, weight gain, fatigue, fevers, chills, and night sweats. Heart: Denies chest pain, palpitations, racing heart, irregular heartbeat, leg pain or swelling, and decreased activity tolerance. Respiratory: Denies breathing difficulty, shortness of breath, wheezing, cough, and sputum. GI: Denies change in appetite, heartburn, nausea, vomiting, constipation, diarrhea, and blood in stool. GU: Denies difficulty urinating, pain with urinating, urgency, frequency, blood in urine.   Objective:      Temp:  [97.8 F (36.6 C)-98.2 F (36.8 C)] 97.8 F (36.6 C) (02/18 1202) Pulse Rate:  [82-84] 84 (02/18 1202) Resp:  [16-20] 20 (02/18 1202) BP: (116-121)/(76-80) 121/80 (02/18 1202) SpO2:  [97 %-98 %] 97 % (02/18 1202)     Height: 6' (182.9 cm) Weight: 101.2 kg BMI (Calculated): 30.24   Intake/Output this shift:   Intake/Output Summary (Last 24 hours) at 08/29/2018 1532 Last data filed at 08/29/2018 1405 Gross per 24 hour  Intake 1080 ml  Output 20 ml  Net 1060 ml   Less than 41ml of bile noted since ERCP     Constitutional :  alert, cooperative, appears stated age and no distress  Respiratory:  clear to auscultation bilaterally  Cardiovascular:  regular rate and rhythm  Gastrointestinal: dressings intact, drain with bilious drainage.   Skin: Cool and moist.   Psychiatric: Normal affect, non-agitated, not confused       LABS:  CMP Latest Ref Rng & Units 08/29/2018 08/28/2018 08/27/2018  Glucose 70 - 99 mg/dL 681(L) 98 572(I)  BUN 6 - 20 mg/dL 10 12 13   Creatinine 0.61 - 1.24 mg/dL 2.03 5.59 7.41  Sodium 135 - 145 mmol/L 139 138 139  Potassium 3.5 - 5.1 mmol/L 3.6 3.9 4.1  Chloride 98 - 111 mmol/L 107 102 103  CO2 22 - 32 mmol/L 28 28 28   Calcium 8.9 - 10.3 mg/dL 8.2(L) 8.5(L) 8.5(L)   Total Protein 6.5 - 8.1 g/dL 6.4(L) 6.7 7.1  Total Bilirubin 0.3 - 1.2 mg/dL 0.8 1.0 0.8  Alkaline Phos 38 - 126 U/L 75 78 86  AST 15 - 41 U/L 46(H) 64(H) 105(H)  ALT 0 - 44 U/L 86(H) 104(H) 133(H)   CBC Latest Ref Rng & Units 08/29/2018 08/29/2018 08/28/2018  WBC 4.0 - 10.5 K/uL - 7.3 10.6(H)  Hemoglobin 13.0 - 17.0 g/dL 63.8 12.4(L) 13.2  Hematocrit 39.0 - 52.0 % 39.8 37.8(L) 40.8  Platelets 150 - 400 K/uL - 204 203    RADS: n/a Assessment:   S/p lap converted to open chole with bile leak noted.  S/p stent placement via ERCP.  Labs continuing to improve, tolerating clears.  Will continue to monitor while advancing diet.  IF no increase in output, will likely d/c tomorrow with close followup.

## 2018-08-29 NOTE — Progress Notes (Signed)
Varnita Tahiliani, MD [address], [address], [place], [place], [address] [address], [address], [place], [place], [address] Phone: [phone]  Fax: [phone]   Subjective: Patient continues to do well.  No abdominal pain.  No nausea or vomiting.   Objective: Exam: Vital signs in last 24 hours: Vitals:   08/28/18 1340 08/28/18 2044 08/29/18 0344 08/29/18 1202  BP: 126/83 116/76 120/77 121/80  Pulse: 64 82 83 84  Resp: 20 16 18 20  Temp: 97.7 F (36.5 C) 98.2 F (36.8 C) 97.9 F (36.6 C) 97.8 F (36.6 C)  TempSrc: Oral Oral Oral Oral  SpO2: 99% 98% 97% 97%  Weight:      Height:       Weight change:   Intake/Output Summary (Last 24 hours) at 08/29/2018 1500 Last data filed at 08/29/2018 1405 Gross per 24 hour  Intake 1482.64 ml  Output 20 ml  Net 1462.64 ml    General: No acute distress, AAO x3 Abd: Soft, NT/ND, No HSM Skin: Warm, no rashes Neck: Supple, Trachea midline   Lab Results: Lab Results  Component Value Date   WBC 7.3 08/29/2018   HGB 13.0 08/29/2018   HCT 39.8 08/29/2018   MCV 89.2 08/29/2018   PLT 204 08/29/2018   Micro Results: No results found for this or any previous visit (from the past 240 hour(s)). Studies/Results: Dg C-arm 1-60 Min-no Report  Result Date: 08/28/2018 Fluoroscopy was utilized by the requesting physician.  No radiographic interpretation.   Medications:  Scheduled Meds: . celecoxib  200 mg Oral Daily  . ciprofloxacin  500 mg Oral BID  . gabapentin  200 mg Oral BID  . metroNIDAZOLE  500 mg Oral Q8H  . pantoprazole (PROTONIX) IV  40 mg Intravenous QHS  . polyethylene glycol  17 g Oral Daily   Continuous Infusions: PRN Meds:.acetaminophen **OR** acetaminophen, docusate sodium, guaiFENesin-dextromethorphan, ondansetron **OR** ondansetron (ZOFRAN) IV   Assessment: Active Problems:   Acute cholecystitis   S/P cholecystectomy   Common bile duct leak    Plan: Patient is status post ERCP with plastic  stent placement with Dr. Wohl for presumed leak post cholecystectomy.  Patient does not have any evidence of pancreatitis His liver enzymes continue to improve.  Total bilirubin and alk phos are normal and transaminases are improving.  Patient remains afebrile Continue expectant management  Patient to follow-up with Dr. Wohl as an outpatient in 4 to 6 weeks to evaluate if plastic stent needs to be removed with upper endoscopy at that time.  GI service will sign off, please page with any questions.   LOS: 2 days   Varnita Tahiliani, MD 08/29/2018, 3:00 PM  

## 2018-08-30 LAB — CBC
HCT: 37.8 % — ABNORMAL LOW (ref 39.0–52.0)
Hemoglobin: 12.5 g/dL — ABNORMAL LOW (ref 13.0–17.0)
MCH: 29.2 pg (ref 26.0–34.0)
MCHC: 33.1 g/dL (ref 30.0–36.0)
MCV: 88.3 fL (ref 80.0–100.0)
Platelets: 230 10*3/uL (ref 150–400)
RBC: 4.28 MIL/uL (ref 4.22–5.81)
RDW: 12.7 % (ref 11.5–15.5)
WBC: 6.2 10*3/uL (ref 4.0–10.5)
nRBC: 0 % (ref 0.0–0.2)

## 2018-08-30 LAB — HEPATIC FUNCTION PANEL
ALT: 91 U/L — ABNORMAL HIGH (ref 0–44)
AST: 43 U/L — ABNORMAL HIGH (ref 15–41)
Albumin: 3.2 g/dL — ABNORMAL LOW (ref 3.5–5.0)
Alkaline Phosphatase: 78 U/L (ref 38–126)
Bilirubin, Direct: 0.1 mg/dL (ref 0.0–0.2)
Indirect Bilirubin: 0.4 mg/dL (ref 0.3–0.9)
TOTAL PROTEIN: 6.7 g/dL (ref 6.5–8.1)
Total Bilirubin: 0.5 mg/dL (ref 0.3–1.2)

## 2018-08-30 LAB — BASIC METABOLIC PANEL
Anion gap: 8 (ref 5–15)
BUN: 11 mg/dL (ref 6–20)
CHLORIDE: 108 mmol/L (ref 98–111)
CO2: 25 mmol/L (ref 22–32)
Calcium: 8.6 mg/dL — ABNORMAL LOW (ref 8.9–10.3)
Creatinine, Ser: 0.82 mg/dL (ref 0.61–1.24)
GFR calc Af Amer: >60 mL/min (ref >60)
GFR calc non Af Amer: >60 mL/min (ref >60)
Glucose, Bld: 96 mg/dL (ref 70–99)
POTASSIUM: 3.4 mmol/L — AB (ref 3.5–5.1)
Sodium: 141 mmol/L (ref 135–145)

## 2018-08-30 LAB — MAGNESIUM: Magnesium: 2 mg/dL (ref 1.7–2.4)

## 2018-08-30 LAB — PHOSPHORUS: PHOSPHORUS: 3.6 mg/dL (ref 2.5–4.6)

## 2018-08-30 MED ORDER — HYDROCODONE-ACETAMINOPHEN 5-325 MG PO TABS: 1.0000 | ORAL_TABLET | Freq: Four times a day (QID) | ORAL | 0 refills | Status: AC | PRN

## 2018-08-30 MED ORDER — IBUPROFEN 800 MG PO TABS: 800.0000 mg | ORAL_TABLET | Freq: Three times a day (TID) | ORAL | 0 refills | Status: DC | PRN

## 2018-08-30 MED ORDER — METRONIDAZOLE 500 MG PO TABS: 500.0000 mg | ORAL_TABLET | Freq: Three times a day (TID) | ORAL | 0 refills | Status: AC

## 2018-08-30 MED ORDER — ACETAMINOPHEN 325 MG PO TABS: 650.0000 mg | ORAL_TABLET | Freq: Three times a day (TID) | ORAL | 0 refills | Status: AC | PRN

## 2018-08-30 MED ORDER — DOCUSATE SODIUM 100 MG PO CAPS: 100.0000 mg | ORAL_CAPSULE | Freq: Two times a day (BID) | ORAL | 0 refills | Status: AC | PRN

## 2018-08-30 MED ORDER — CIPROFLOXACIN HCL 500 MG PO TABS: 500.0000 mg | ORAL_TABLET | Freq: Two times a day (BID) | ORAL | 0 refills | Status: AC

## 2018-08-30 NOTE — Discharge Instructions (Signed)
Open Cholecystectomy, Care After This sheet gives you information about how to care for yourself after your procedure. Your health care provider may also give you more specific instructions. If you have problems or questions, contact your health care provider. What can I expect after the procedure? After the procedure, it is common to have:  Pain at your incision site. You will be given medicines to control this pain.  Mild nausea or vomiting. Follow these instructions at home: Incision care   Follow instructions from your health care provider about how to take care of your incision. Make sure you: ? Wash your hands with soap and water before you change your bandage (dressing). If soap and water are not available, use hand sanitizer. ? Change your dressing as told by your health care provider. ? Leave stitches (sutures), skin glue, or adhesive strips in place. These skin closures may need to be in place for 2 weeks or longer. If adhesive strip edges start to loosen and curl up, you may trim the loose edges. Do not remove adhesive strips completely unless your health care provider tells you to do that.  Do not take baths, swim, or use a hot tub until your health care provider approves. OK TO SHOWER.  CHANGE DRAIN DRESSING DAILY.    Check your incision area every day for signs of infection. Check for: ? More redness, swelling, or pain. ? More fluid or blood. ? Warmth. ? Pus or a bad smell. Activity  Do not drive or use heavy machinery while taking prescription pain medicine.  Do not lift anything that is heavier than 10 lb (4.5 kg) until your health care provider approves.  Do not play contact sports until your health care provider approves.  Do not drive for 24 hours if you were given a medicine to help you relax (sedative).  OK TO STAY OUT OF WORK UNTIL FOLLOWUP APPOINTMENT General instructions  tylenol and advil as needed for discomfort.  Please alternate between the two every  four hours as needed for pain.    Use narcotics, if prescribed, only when tylenol and motrin is not enough to control pain.  325-650mg  every 8hrs to max of 4000mg /24hrs (including the 325mg  in every norco dose) for the tylenol.    Advil up to 800mg  per dose every 8hrs as needed for pain.    To prevent or treat constipation while you are taking prescription pain medicine, your health care provider may recommend that you: ? Drink enough fluid to keep your urine clear or pale yellow. ? Take over-the-counter or prescription medicines. ? Eat foods that are high in fiber, such as fresh fruits and vegetables, whole grains, and beans. ? Limit foods that are high in fat and processed sugars, such as fried and sweet foods. Contact a health care provider if:  You develop a rash.  You have more redness, swelling, or pain around your incision.  You have more fluid or blood coming from your incision.  Your incision feels warm to the touch.  You have pus or a bad smell coming from your incision.  You have a fever.  Your incision breaks open. Get help right away if:  You have trouble breathing.  You have chest pain.  You have increasing pain in your shoulders.  You faint or feel dizzy when you stand.  You have severe pain in your abdomen.  You have nausea or vomiting that lasts for more than one day.  You have leg pain. This information  is not intended to replace advice given to you by your health care provider. Make sure you discuss any questions you have with your health care provider. Document Released: 10/14/2003 Document Revised: 01/17/2016 Document Reviewed: 12/15/2015 Elsevier Interactive Patient Education  2019 [employer].

## 2018-08-30 NOTE — Discharge Summary (Signed)
Physician Discharge Summary  Patient ID: William Dennis MRN: [id number] DOB/AGE: 18-Mar-1986 33 y.o.  Admit date: 08/25/2018 Discharge date: 08/30/2018  Admission Diagnoses: acute cholecystitis  Discharge Diagnoses:  Same as above  Discharged Condition: good  Hospital Course: Patient diagnosed with the above.  Underwent laparoscopic converted to open subtotal cholecystectomy due to a extremely edematous and inflamed gallbladder.  Afterwards bile leak was noted and the JP drain that was left in place, therefore GI was consulted.  He underwent an ERCP with stent placement overlying the presumed bile duct injury without any complications.  Afterwards patient pain remained well controlled diet was slowly advanced without any increase in biliary output from the JP drain was deemed appropriate for discharge with close outpatient monitoring.  Will be leaving with a drain in place and returning to the office in 1 week for possible drain removal as well as staple removal at that time.  Oral antibiotics will be continued as well.  He will follow-up with the GI clinic in 4 to 6 weeks for reassessment of the stent.  Consults: GI  Discharge Exam: Blood pressure 119/76, pulse 77, temperature 97.7 F (36.5 C), temperature source Oral, resp. rate 18, height 6' (1.829 m), weight 101.2 kg, SpO2 97 %. General appearance: alert, cooperative and no distress GI: Soft, no guarding, incision staples clean dry and intact.  P drain in right upper quadrant draining minimal bile at this point. Incision/Wound: Clean dry intact  Disposition:  Discharge disposition: 01-Home or Self Care       Discharge Instructions    Discharge patient   Complete by:  As directed    Discharge disposition:  01-Home or Self Care   Discharge patient date:  08/30/2018     Allergies as of 08/30/2018      Reactions   Codeine       Medication List    STOP taking these medications   oxyCODONE-acetaminophen 5-325 MG  tablet Commonly known as:  PERCOCET/ROXICET   traMADol 50 MG tablet Commonly known as:  ULTRAM     TAKE these medications   acetaminophen 325 MG tablet Commonly known as:  TYLENOL Take 2 tablets (650 mg total) by mouth every 8 (eight) hours as needed for up to 30 days for mild pain.   ciprofloxacin 500 MG tablet Commonly known as:  CIPRO Take 1 tablet (500 mg total) by mouth 2 (two) times daily for 5 days.   docusate sodium 100 MG capsule Commonly known as:  COLACE Take 1 capsule (100 mg total) by mouth 2 (two) times daily as needed for up to 10 days for mild constipation.   HYDROcodone-acetaminophen 5-325 MG tablet Commonly known as:  NORCO Take 1 tablet by mouth every 6 (six) hours as needed for up to 3 days for moderate pain.   ibuprofen 800 MG tablet Commonly known as:  ADVIL,MOTRIN Take 1 tablet (800 mg total) by mouth every 8 (eight) hours as needed for mild pain or moderate pain. What changed:    medication strength  how much to take  when to take this  reasons to take this   metroNIDAZOLE 500 MG tablet Commonly known as:  FLAGYL Take 1 tablet (500 mg total) by mouth every 8 (eight) hours for 5 days.      Follow-up Information    [place], Naelle Diegel, DO. Go on 09/08/2018.   Specialty:  Surgery Why:  @10 :45am for possible staple and drain removal Contact information: [address] [place] [address] [phone]  [facility]. Go on 10/24/2018.   Specialty:  Gastroenterology Why:  @3pm  with Dr. Servando Snare for possible stent removal s/p ERCP Contact information: [address] [place] [address] [phone]           Total time spent arranging discharge was >29min. Signed: Sung Amabile 08/30/2018, 9:22 AM

## 2018-08-30 NOTE — Progress Notes (Signed)
William Dennis  A and O x 4. VSS. Pt tolerating diet well. No complaints of pain or nausea. IV removed intact, prescriptions given. Pt voiced understanding of discharge instructions with no further questions. Pt discharged via wheelchair with axillary. RN educated pt how to empty his JP drain.    Allergies as of 08/30/2018      Reactions   Codeine       Medication List    STOP taking these medications   oxyCODONE-acetaminophen 5-325 MG tablet Commonly known as:  PERCOCET/ROXICET   traMADol 50 MG tablet Commonly known as:  ULTRAM     TAKE these medications   acetaminophen 325 MG tablet Commonly known as:  TYLENOL Take 2 tablets (650 mg total) by mouth every 8 (eight) hours as needed for up to 30 days for mild pain.   ciprofloxacin 500 MG tablet Commonly known as:  CIPRO Take 1 tablet (500 mg total) by mouth 2 (two) times daily for 5 days.   docusate sodium 100 MG capsule Commonly known as:  COLACE Take 1 capsule (100 mg total) by mouth 2 (two) times daily as needed for up to 10 days for mild constipation.   HYDROcodone-acetaminophen 5-325 MG tablet Commonly known as:  NORCO Take 1 tablet by mouth every 6 (six) hours as needed for up to 3 days for moderate pain.   ibuprofen 800 MG tablet Commonly known as:  ADVIL,MOTRIN Take 1 tablet (800 mg total) by mouth every 8 (eight) hours as needed for mild pain or moderate pain. What changed:    medication strength  how much to take  when to take this  reasons to take this   metroNIDAZOLE 500 MG tablet Commonly known as:  FLAGYL Take 1 tablet (500 mg total) by mouth every 8 (eight) hours for 5 days.       Vitals:   08/29/18 2059 08/30/18 0517  BP: 123/70 119/76  Pulse: 86 77  Resp: 18 18  Temp: 98.4 F (36.9 C) 97.7 F (36.5 C)  SpO2: 98% 97%    William Dennis

## 2018-09-27 ENCOUNTER — Emergency Department: Payer: BLUE CROSS/BLUE SHIELD

## 2018-09-27 ENCOUNTER — Other Ambulatory Visit: Payer: Self-pay

## 2018-09-27 ENCOUNTER — Emergency Department
Admission: EM | Admit: 2018-09-27 | Discharge: 2018-09-27 | Disposition: A | Discharge status: Home / Self Care | Payer: BLUE CROSS/BLUE SHIELD | Attending: Emergency Medicine | Admitting: Emergency Medicine

## 2018-09-27 DIAGNOSIS — R079 Chest pain, unspecified: Secondary | ICD-10-CM | POA: Diagnosis present

## 2018-09-27 DIAGNOSIS — M546 Pain in thoracic spine: Secondary | ICD-10-CM | POA: Insufficient documentation

## 2018-09-27 DIAGNOSIS — R1011 Right upper quadrant pain: Secondary | ICD-10-CM | POA: Insufficient documentation

## 2018-09-27 DIAGNOSIS — R1013 Epigastric pain: Secondary | ICD-10-CM

## 2018-09-27 DIAGNOSIS — R109 Unspecified abdominal pain: Secondary | ICD-10-CM

## 2018-09-27 DIAGNOSIS — Z79899 Other long term (current) drug therapy: Secondary | ICD-10-CM | POA: Insufficient documentation

## 2018-09-27 LAB — CBC WITH DIFFERENTIAL/PLATELET
ABS IMMATURE GRANULOCYTES: 0.02 10*3/uL (ref 0.00–0.07)
Basophils Absolute: 0 10*3/uL (ref 0.0–0.1)
Basophils Relative: 1 %
Eosinophils Absolute: 0.1 10*3/uL (ref 0.0–0.5)
Eosinophils Relative: 2 %
HCT: 45.5 % (ref 39.0–52.0)
Hemoglobin: 15.1 g/dL (ref 13.0–17.0)
Immature Granulocytes: 0 %
Lymphocytes Relative: 36 %
Lymphs Abs: 2 10*3/uL (ref 0.7–4.0)
MCH: 29.6 pg (ref 26.0–34.0)
MCHC: 33.2 g/dL (ref 30.0–36.0)
MCV: 89.2 fL (ref 80.0–100.0)
MONOS PCT: 8 %
Monocytes Absolute: 0.4 10*3/uL (ref 0.1–1.0)
Neutro Abs: 2.9 10*3/uL (ref 1.7–7.7)
Neutrophils Relative %: 53 %
Platelets: 176 10*3/uL (ref 150–400)
RBC: 5.1 MIL/uL (ref 4.22–5.81)
RDW: 13.9 % (ref 11.5–15.5)
WBC: 5.5 10*3/uL (ref 4.0–10.5)
nRBC: 0 % (ref 0.0–0.2)

## 2018-09-27 LAB — COMPREHENSIVE METABOLIC PANEL
ALK PHOS: 72 U/L (ref 38–126)
ALT: 57 U/L — ABNORMAL HIGH (ref 0–44)
AST: 25 U/L (ref 15–41)
Albumin: 4.4 g/dL (ref 3.5–5.0)
Anion gap: 9 (ref 5–15)
BUN: 13 mg/dL (ref 6–20)
CO2: 25 mmol/L (ref 22–32)
CREATININE: 0.91 mg/dL (ref 0.61–1.24)
Calcium: 9.3 mg/dL (ref 8.9–10.3)
Chloride: 106 mmol/L (ref 98–111)
GFR calc Af Amer: >60 mL/min (ref >60)
GFR calc non Af Amer: >60 mL/min (ref >60)
Glucose, Bld: 88 mg/dL (ref 70–99)
Potassium: 3.8 mmol/L (ref 3.5–5.1)
Sodium: 140 mmol/L (ref 135–145)
Total Bilirubin: 0.9 mg/dL (ref 0.3–1.2)
Total Protein: 7.7 g/dL (ref 6.5–8.1)

## 2018-09-27 LAB — LIPASE, BLOOD: Lipase: 35 U/L (ref 11–51)

## 2018-09-27 LAB — TROPONIN I: Troponin I: <0.03 ng/mL (ref <0.03)

## 2018-09-27 MED ORDER — ONDANSETRON HCL 4 MG/2ML IJ SOLN
4.0000 mg | Freq: Once | INTRAMUSCULAR | Status: AC
  Administered 2018-09-27: 4 mg via INTRAVENOUS
  Filled 2018-09-27: qty 2

## 2018-09-27 MED ORDER — KETOROLAC TROMETHAMINE 30 MG/ML IJ SOLN
15.0000 mg | Freq: Once | INTRAMUSCULAR | Status: AC
  Administered 2018-09-27: 15 mg via INTRAVENOUS
  Filled 2018-09-27: qty 1

## 2018-09-27 MED ORDER — GADOBUTROL 1 MMOL/ML IV SOLN
9.0000 mL | Freq: Once | INTRAVENOUS | Status: AC | PRN
  Administered 2018-09-27: 9 mL via INTRAVENOUS

## 2018-09-27 NOTE — ED Notes (Addendum)
Patient speaking with MRI for screening.

## 2018-09-27 NOTE — ED Provider Notes (Signed)
[facility] Emergency Department Provider Note  ____________________________________________  Time seen: Approximately 10:28 AM  I have reviewed the triage vital signs and the nursing notes.   HISTORY  Chief Complaint Chest Pain   HPI William Dennis is a 33 y.o. male status post open cholecystectomy with biliary leak 1 month ago by Dr. Tonna Boehringer presents for evaluation of pain.  Patient reports over the last 2 weeks having had intermittent episodes that he describes as spasms of the chest, upper abdomen, and upper back worse on the left side.  Patient reports that when he presented with acute cholecystitis his symptoms were mostly left-sided chest pain.  These episodes are short-lived and were happening several times a day.  He saw Dr. Tonna Boehringer in the office last week with normal labs and he was told to come to the emergency room if symptoms recurred.  Patient reports over the last 3 to 4 days he had no further episodes of pain.  This morning he woke up and the pain was severe, mostly on his upper back radiating to the lower back, same quality as in the past.  Had some nausea but no vomiting, no abdominal pain, no fever or chills.  Patient denies any personal or close family history of heart disease, he is not a smoker.  PMH None - reviewed  Patient Active Problem List   Diagnosis Date Noted   Common bile duct leak    S/P cholecystectomy 08/27/2018   Acute cholecystitis 08/25/2018    Past Surgical History:  Procedure Laterality Date   CHOLECYSTECTOMY N/A 08/26/2018   Procedure: LAPAROSCOPIC CHOLECYSTECTOMY changed to open;  Surgeon: Sung Amabile, DO;  Location: [facility] ORS;  Service: General;  Laterality: N/A;   CHOLECYSTECTOMY  08/26/2018   Procedure: CHOLECYSTECTOMY;  Surgeon: Sung Amabile, DO;  Location: [facility] ORS;  Service: General;;   ERCP N/A 08/28/2018   Procedure: ENDOSCOPIC RETROGRADE CHOLANGIOPANCREATOGRAPHY (ERCP);  Surgeon: Midge Minium, MD;  Location:  [facility] ENDOSCOPY;  Service: Endoscopy;  Laterality: N/A;    Prior to Admission medications   Medication Sig Start Date End Date Taking? Authorizing Provider  omeprazole (PRILOSEC OTC) 20 MG tablet Take 20 mg by mouth daily.   Yes [provider]  acetaminophen (TYLENOL) 325 MG tablet Take 2 tablets (650 mg total) by mouth every 8 (eight) hours as needed for up to 30 days for mild pain. Patient not taking: Reported on 09/27/2018 08/30/18 09/29/18  Sung Amabile, DO  ibuprofen (ADVIL,MOTRIN) 800 MG tablet Take 1 tablet (800 mg total) by mouth every 8 (eight) hours as needed for mild pain or moderate pain. Patient not taking: Reported on 09/27/2018 08/30/18   Sung Amabile, DO    Allergies Codeine  FH Diabetes Mother    Myocardial Infarction (Heart attack) Paternal Grandfather       Social History Social History   Tobacco Use   Smoking status: Never Smoker   Smokeless tobacco: Never Used  Substance Use Topics   Alcohol use: No   Drug use: Never    Review of Systems  Constitutional: Negative for fever. Eyes: Negative for visual changes. ENT: Negative for sore throat. Neck: No neck pain  Cardiovascular: + chest pain. Respiratory: Negative for shortness of breath. Gastrointestinal: Negative for abdominal pain, vomiting or diarrhea. + nausea Genitourinary: Negative for dysuria. Musculoskeletal: + back pain. Skin: Negative for rash. Neurological: Negative for headaches, weakness or numbness. Psych: No SI or HI  ____________________________________________   PHYSICAL EXAM:  VITAL SIGNS: ED Triage Vitals  Enc Vitals Group     BP 09/27/18 1007 125/82     Pulse Rate 09/27/18 1007 79     Resp 09/27/18 1007 16     Temp --      Temp src --      SpO2 09/27/18 1007 98 %     Weight 09/27/18 0954 214 lb (97.1 kg)     Height 09/27/18 0954 6' (1.829 m)     Head Circumference --      Peak Flow --      Pain Score 09/27/18 0953 3     Pain Loc --      Pain Edu? --       Excl. in GC? --     Constitutional: Alert and oriented. Well appearing and in no apparent distress. HEENT:      Head: Normocephalic and atraumatic.         Eyes: Conjunctivae are normal. Sclera is non-icteric.       Mouth/Throat: Mucous membranes are moist.       Neck: Supple with no signs of meningismus. Cardiovascular: Regular rate and rhythm. No murmurs, gallops, or rubs. 2+ symmetrical distal pulses are present in all extremities. No JVD. Respiratory: Normal respiratory effort. Lungs are clear to auscultation bilaterally. No wheezes, crackles, or rhonchi.  Gastrointestinal: Soft, mild epigastric tenderness, and non distended with positive bowel sounds. No rebound or guarding. Genitourinary: No CVA tenderness. Musculoskeletal: Nontender with normal range of motion in all extremities. No edema, cyanosis, or erythema of extremities. Neurologic: Normal speech and language. Face is symmetric. Moving all extremities. No gross focal neurologic deficits are appreciated. Skin: Skin is warm, dry and intact. No rash noted. Psychiatric: Mood and affect are normal. Speech and behavior are normal.  ____________________________________________   LABS (all labs ordered are listed, but only abnormal results are displayed)  Labs Reviewed  COMPREHENSIVE METABOLIC PANEL - Abnormal; Notable for the following components:      Result Value   ALT 57 (*)    All other components within normal limits  TROPONIN I  LIPASE, BLOOD  CBC WITH DIFFERENTIAL/PLATELET   ____________________________________________  EKG  ED ECG REPORT I, Nita Sickle, the attending physician, personally viewed and interpreted this ECG.  NSR, rate 76, normal intervals, normal axis, no STE or depression. Normal ekg  ____________________________________________  RADIOLOGY  I have personally reviewed the images performed during this visit and I agree with the Radiologist's read.   Interpretation by Radiologist:  Dg  Chest 2 View  Result Date: 09/27/2018 CLINICAL DATA:  Chest pain EXAM: CHEST - 2 VIEW COMPARISON:  August 22, 2018 FINDINGS: Lungs are clear. Heart size and pulmonary vascularity are normal. No adenopathy. No bone lesions. No pneumothorax. IMPRESSION: No edema or consolidation. Electronically Signed   By: Bretta Bang III M.D.   On: 09/27/2018 10:15   Mr 3d Recon At Scanner  Result Date: 09/27/2018 CLINICAL DATA:  Right upper quadrant abdominal pain. History of cholecystectomy 1 month ago. EXAM: MRI ABDOMEN WITHOUT AND WITH CONTRAST (INCLUDING MRCP) TECHNIQUE: Multiplanar multisequence MR imaging of the abdomen was performed both before and after the administration of intravenous contrast. Heavily T2-weighted images of the biliary and pancreatic ducts were obtained, and three-dimensional MRCP images were rendered by post processing. CONTRAST:  9 cc Gadavist COMPARISON:  Right upper quadrant ultrasound examination same date. FINDINGS: Lower chest: The lung bases are grossly clear. No infiltrates or effusions. No pericardial effusion. Hepatobiliary: No focal hepatic lesions or intrahepatic biliary dilatation. The gallbladder  is surgically absent. Normal caliber and course of the common bile duct. No common bile duct stones are identified. Pancreas:  No mass, inflammation or ductal dilatation. Spleen:  Normal size.  No focal lesions. Adrenals/Urinary Tract: The adrenal glands and kidneys are unremarkable. Small left renal cyst is noted. Stomach/Bowel: Visualized portions within the abdomen are unremarkable. Vascular/Lymphatic: No pathologically enlarged lymph nodes identified. No abdominal aortic aneurysm demonstrated. Other: No focal fluid collections to suggest a bile leak. No abdominal wall hernia. Musculoskeletal: There is mild inflammation/edema involving the right abdominal wall/flank area including the oblique abdominal muscles. I assume this is likely related to the patient's prior surgery but  recommend correlation any focal or localized tenderness or inflammation which could suggest cellulitis or myofasciitis. I do not see a large hematoma. IMPRESSION: 1. Status post cholecystectomy. No MR findings to suggest a bile leak. 2. Normal pancreaticobiliary tree. No biliary dilatation or common bile duct stone. 3. Inflammation/edema and mild enhancement involving the right lower abdominal wall muscles and subcutaneous tissues which may be related to recent surgery. Could not exclude cellulitis and myositis. Recommend correlation with clinical findings. I do not see any findings for a postoperative abscess or large hematoma. Electronically Signed   By: Rudie Meyer M.D.   On: 09/27/2018 14:04   Mr Abdomen Mrcp Vivien Rossetti Contast  Result Date: 09/27/2018 CLINICAL DATA:  Right upper quadrant abdominal pain. History of cholecystectomy 1 month ago. EXAM: MRI ABDOMEN WITHOUT AND WITH CONTRAST (INCLUDING MRCP) TECHNIQUE: Multiplanar multisequence MR imaging of the abdomen was performed both before and after the administration of intravenous contrast. Heavily T2-weighted images of the biliary and pancreatic ducts were obtained, and three-dimensional MRCP images were rendered by post processing. CONTRAST:  9 cc Gadavist COMPARISON:  Right upper quadrant ultrasound examination same date. FINDINGS: Lower chest: The lung bases are grossly clear. No infiltrates or effusions. No pericardial effusion. Hepatobiliary: No focal hepatic lesions or intrahepatic biliary dilatation. The gallbladder is surgically absent. Normal caliber and course of the common bile duct. No common bile duct stones are identified. Pancreas:  No mass, inflammation or ductal dilatation. Spleen:  Normal size.  No focal lesions. Adrenals/Urinary Tract: The adrenal glands and kidneys are unremarkable. Small left renal cyst is noted. Stomach/Bowel: Visualized portions within the abdomen are unremarkable. Vascular/Lymphatic: No pathologically enlarged lymph  nodes identified. No abdominal aortic aneurysm demonstrated. Other: No focal fluid collections to suggest a bile leak. No abdominal wall hernia. Musculoskeletal: There is mild inflammation/edema involving the right abdominal wall/flank area including the oblique abdominal muscles. I assume this is likely related to the patient's prior surgery but recommend correlation any focal or localized tenderness or inflammation which could suggest cellulitis or myofasciitis. I do not see a large hematoma. IMPRESSION: 1. Status post cholecystectomy. No MR findings to suggest a bile leak. 2. Normal pancreaticobiliary tree. No biliary dilatation or common bile duct stone. 3. Inflammation/edema and mild enhancement involving the right lower abdominal wall muscles and subcutaneous tissues which may be related to recent surgery. Could not exclude cellulitis and myositis. Recommend correlation with clinical findings. I do not see any findings for a postoperative abscess or large hematoma. Electronically Signed   By: Rudie Meyer M.D.   On: 09/27/2018 14:04   US Abdomen Limited Ruq  Addendum Date: 09/27/2018   ADDENDUM REPORT: 09/27/2018 11:41 ADDENDUM: By report, the patient had a recent biliary duct stent. The focal area of increased echogenicity in the proximal common bile duct appears more focal than would be  expected with a stent. MRCP could be quite helpful for distinguishing between portion of stent versus focal calculus. Electronically Signed   By: Bretta Bang III M.D.   On: 09/27/2018 11:41   Result Date: 09/27/2018 CLINICAL DATA:  Abdominal pain EXAM: ULTRASOUND ABDOMEN LIMITED RIGHT UPPER QUADRANT COMPARISON:  CT abdomen and pelvis August 25, 2018 FINDINGS: Gallbladder: Surgically absent. No lesion evident in the gallbladder fossa region. Common bile duct: Diameter: 6 mm. There is no intrahepatic or extrahepatic biliary duct dilatation. There is an 8 mm echogenic focus in the proximal common bile duct,  suspicious for a calculus within the common bile duct. Liver: No focal lesion identified. Liver echogenicity is increased diffusely. Portal vein is patent on color Doppler imaging with normal direction of blood flow towards the liver. IMPRESSION: 1. 8 mm echogenic focus with in the proximal common bile duct, suspicious for common bile duct calculus. There is no appreciable biliary duct dilatation. From an imaging standpoint, MRCP would be the imaging study of choice to further assess the biliary ductal system. 2.  Gallbladder absent.  No lesions seen in the gallbladder fossa. 3. Diffuse increase in liver echogenicity, a finding indicative of hepatic steatosis. While no focal liver lesions are evident on this study, it must be cautioned that the sensitivity of ultrasound for detection of focal liver lesions is diminished in this circumstance. Electronically Signed: By: Bretta Bang III M.D. On: 09/27/2018 11:17     ____________________________________________   PROCEDURES  Procedure(s) performed: None Procedures Critical Care performed:  None ____________________________________________   INITIAL IMPRESSION / ASSESSMENT AND PLAN / ED COURSE   33 y.o. male status post open cholecystectomy with biliary leak 1 month ago by Dr. Tonna Boehringer presents for evaluation of back and epigastric pain.  Patient is describing 2 weeks of intermittent pain in his chest and upper back with a more severe episode today.  According to patient he presented with similar pain when he had cholecystitis a month ago.  Currently has a stent in his common bile duct.  Abdomen is soft with mild epigastric and right upper quadrant tenderness with no rebound or guarding.  Differential diagnosis including postop complications versus ACS versus muscle spasms.  Will do right upper quadrant ultrasound, will check basic labs.  EKG showed no ischemic changes.  Will give Toradol and Zofran.  Clinical Course as of Sep 27 1419  Wed Sep 27, 2018  1302 Ultrasound concerning for retained 8 mm stone in the common bile duct.  Patient has normal labs otherwise.  Discussed with patient surgeon Dr. Tonna Boehringer and his GI doctor Dr. Servando Snare who recommended MRCP.   [CV]  1413 MRCP showing no evidence of stone in the common bile duct.  Radiologist did see some edema around the abdominal wall.  I reexamined patient who has an extremely well-healing scar with no evidence of cellulitis, no tenderness, no crepitus.  Therefore low suspicion of an infection at this time.  Discussed again with patient surgeon Dr. Tonna Boehringer who agrees with my evaluation. Will dc home on supportive care and f/u. Discussed standard return precautions with patient    [CV]    Clinical Course User Index [CV] Don Perking [place], MD     As part of my medical decision making, I reviewed the following data within the electronic MEDICAL RECORD NUMBER Nursing notes reviewed and incorporated, Labs reviewed , EKG interpreted , Old chart reviewed, Radiograph reviewed , A consult was requested and obtained from this/these consultant(s) Surgery and GI, Notes from  prior ED visits and [place] Controlled Substance Database    Pertinent labs & imaging results that were available during my care of the patient were reviewed by me and considered in my medical decision making (see chart for details).    ____________________________________________   FINAL CLINICAL IMPRESSION(S) / ED DIAGNOSES  Final diagnoses:  Epigastric abdominal pain  Epigastric pain      NEW MEDICATIONS STARTED DURING THIS VISIT:  ED Discharge Orders    None       Note:  This document was prepared using Dragon voice recognition software and may include unintentional dictation errors.    Don PerkingVeronese, [place], MD 09/27/18 [phone]

## 2018-09-27 NOTE — ED Triage Notes (Signed)
Pt arrved via POV with chest pain. Pt had gallbladder taken out last month. Pain started this AM pain started and radiates to back.

## 2018-09-27 NOTE — ED Notes (Signed)
EDP to bedside to provide updated plan of care.

## 2018-09-27 NOTE — Progress Notes (Signed)
Update: Received call from ER regarding visit of patient due to increasing pain in the right upper quadrant area.  Initial work-up was unremarkable including labs, but right upper quadrant ultrasound showed questionable area of hypoechoic density within the common bile duct.  Discussed imagings with attending radiologist, who stated that even with the given history of ERCP the imaging is not consistent with typical stents.  Afterwards, I recommended the emergency department provider contact the GI provider that performed his ERCP for further recommendations.  Follow-up from that discussion resulted in an MRCP which was essentially negative, I recommended to the ER provider that the patient can follow-up with me as needed since I received verbal report that the incision did not show any obvious signs of infection despite the MRI reading.  She verbalized understanding and agrees with plan.

## 2018-09-27 NOTE — ED Notes (Signed)
Patient transported to MRI 

## 2018-09-27 NOTE — Discharge Instructions (Addendum)

## 2018-10-24 ENCOUNTER — Ambulatory Visit: Payer: BLUE CROSS/BLUE SHIELD | Admitting: Gastroenterology

## 2018-10-31 ENCOUNTER — Ambulatory Visit (INDEPENDENT_AMBULATORY_CARE_PROVIDER_SITE_OTHER): Payer: BLUE CROSS/BLUE SHIELD | Admitting: Gastroenterology

## 2018-10-31 ENCOUNTER — Other Ambulatory Visit: Payer: Self-pay

## 2018-10-31 ENCOUNTER — Encounter: Payer: Self-pay | Admitting: Gastroenterology

## 2018-10-31 DIAGNOSIS — K838 Other specified diseases of biliary tract: Secondary | ICD-10-CM | POA: Diagnosis not present

## 2018-10-31 NOTE — Progress Notes (Signed)
William Minium, MD [address]  [address]  [place], [place] [address]  Main: [phone]  Fax: [phone]    Gastroenterology Virtual/Video Visit  Referring Provider:     Hospital follow up Primary Care Physician:  Patient, No Pcp Per Primary Gastroenterologist:  Dr.Jauan Jasmain Ahlberg Servando Snare Reason for Follow up:     Follow up after hospital admittion        HPI:    Virtual Visit via Video Note Location of the patient: Home Location of provider: Office  Participating persons: The patient myself and Ginger Feldpausch.  I connected with William Dennis on 10/31/18 at  8:30 AM EDT by a video enabled telemedicine application and verified that I am speaking with the correct person using two identifiers.   I discussed the limitations of evaluation and management by telemedicine and the availability of in person appointments. The patient expressed understanding and agreed to proceed.  Verbal consent to proceed obtained.  History of Present Illness: William Dennis is a 33 y.o. male referred by Dr. Patient, No Pcp Per  for consultation & management of CBD stent.  This patient was in the hospital in early February for a cholecystectomy and postcholecystectomy bile duct leak.  The patient had a 10 French 9 cm stent placed via ERCP on 217 and was discharged from the hospital shortly after that.  The patient then had some chest pain and went to the emergency room whereupon he underwent an ultrasound that suggested a possible common bile duct stone but did not report seeing a stent.  The patient was then sent down to MRI for an MRCP that was reported to show no common bile duct stent or common bile duct stones.  Was also reported that there was no sign of the patient having any further bile duct leak.  Patient had reported that his pain was epigastric and radiated to his back.  The patient was discharged from the ER and is now following up with me.  The patient's repeat liver enzymes in the emergency room  on March 18 showed that his liver enzymes had come down except for his ALT which was 57.  The patient reports that he has been feeling very well without any complaints recently.  He states that the chest pain he went to the ER with has completely resolved.  He was aware that the stent was no longer seen in his bile duct on his most recent ER visit.  He is maintaining social distancing and working from home.  The only issue he has is that he reports some numbness around the incision site from his surgery.  No past medical history on file.  Past Surgical History:  Procedure Laterality Date  . CHOLECYSTECTOMY N/A 08/26/2018   Procedure: LAPAROSCOPIC CHOLECYSTECTOMY changed to open;  Surgeon: Sung Amabile, DO;  Location: [facility] ORS;  Service: General;  Laterality: N/A;  . CHOLECYSTECTOMY  08/26/2018   Procedure: CHOLECYSTECTOMY;  Surgeon: Sung Amabile, DO;  Location: [facility] ORS;  Service: General;;  . ERCP N/A 08/28/2018   Procedure: ENDOSCOPIC RETROGRADE CHOLANGIOPANCREATOGRAPHY (ERCP);  Surgeon: William Minium, MD;  Location: [facility] ENDOSCOPY;  Service: Endoscopy;  Laterality: N/A;    Prior to Admission medications   Medication Sig Start Date End Date Taking? Authorizing Provider  ibuprofen (ADVIL,MOTRIN) 800 MG tablet Take 1 tablet (800 mg total) by mouth every 8 (eight) hours as needed for mild pain or moderate pain. Patient not taking: Reported on 09/27/2018 08/30/18   Sung Amabile, DO  omeprazole (PRILOSEC  OTC) 20 MG tablet Take 20 mg by mouth daily.    [provider]    No family history on file.   Social History   Tobacco Use  . Smoking status: Never Smoker  . Smokeless tobacco: Never Used  Substance Use Topics  . Alcohol use: No  . Drug use: Never    Allergies as of 10/31/2018 - Review Complete 09/27/2018  Allergen Reaction Noted  . Codeine Anxiety 08/22/2018    Review of Systems:    All systems reviewed and negative except where noted in HPI.   Observations/Objective:   Labs: CBC    Component Value Date/Time   WBC 5.5 09/27/2018 1029   RBC 5.10 09/27/2018 1029   HGB 15.1 09/27/2018 1029   HCT 45.5 09/27/2018 1029   PLT 176 09/27/2018 1029   MCV 89.2 09/27/2018 1029   MCH 29.6 09/27/2018 1029   MCHC 33.2 09/27/2018 1029   RDW 13.9 09/27/2018 1029   LYMPHSABS 2.0 09/27/2018 1029   MONOABS 0.4 09/27/2018 1029   EOSABS 0.1 09/27/2018 1029   BASOSABS 0.0 09/27/2018 1029   CMP     Component Value Date/Time   NA 140 09/27/2018 1029   K 3.8 09/27/2018 1029   CL 106 09/27/2018 1029   CO2 25 09/27/2018 1029   GLUCOSE 88 09/27/2018 1029   BUN 13 09/27/2018 1029   CREATININE 0.91 09/27/2018 1029   CALCIUM 9.3 09/27/2018 1029   PROT 7.7 09/27/2018 1029   ALBUMIN 4.4 09/27/2018 1029   AST 25 09/27/2018 1029   ALT 57 (H) 09/27/2018 1029   ALKPHOS 72 09/27/2018 1029   BILITOT 0.9 09/27/2018 1029   GFRNONAA >60 09/27/2018 1029   GFRAA >60 09/27/2018 1029    Imaging Studies: No results found.  Assessment and Plan:   William BrookingCharles Dennis is a 33 y.o. y/o male has a history of a bile duct leak with an ERCP.  The ERCP had a stent placed and the patient was discharged from the hospital.  Approximately 1 month later the patient had some chest pain and went to the ER and had imaging that showed that the stent was no longer in place.  The patient has been explained that there are times that the stents will fall out on their own but since his MRI did not show any further fluid collection that his leak had likely closed up.  The patient feels well now except for some numbness around the incision site and has been told to follow-up with surgery for that.  He has also been told to contact my office if he has any further GI issues.  Follow Up Instructions:  I discussed the assessment and treatment plan with the patient. The patient was provided an opportunity to ask questions and all were answered. The patient agreed with the plan and demonstrated an understanding of  the instructions.   The patient was advised to call back or seek an in-person evaluation if the symptoms worsen or if the condition fails to improve as anticipated.  I provided 15 minutes of non-face-to-face time during this encounter.   William Miniumarren Revonda Menter, MD  Speech recognition software was used to dictate the above note.

## 2019-04-25 ENCOUNTER — Other Ambulatory Visit: Payer: Self-pay

## 2019-04-25 DIAGNOSIS — Z20822 Contact with and (suspected) exposure to covid-19: Secondary | ICD-10-CM

## 2019-04-27 LAB — NOVEL CORONAVIRUS, NAA: SARS-CoV-2, NAA: NOT DETECTED

## 2019-09-05 ENCOUNTER — Ambulatory Visit (INDEPENDENT_AMBULATORY_CARE_PROVIDER_SITE_OTHER): Payer: BC Managed Care – PPO | Admitting: Family Medicine

## 2019-09-05 ENCOUNTER — Encounter: Payer: Self-pay | Admitting: Family Medicine

## 2019-09-05 ENCOUNTER — Other Ambulatory Visit: Payer: Self-pay

## 2019-09-05 VITALS — BP 130/88 | HR 96 | Temp 98.7°F | Resp 14 | Ht 71.0 in | Wt 224.8 lb

## 2019-09-05 DIAGNOSIS — Z7689 Persons encountering health services in other specified circumstances: Secondary | ICD-10-CM

## 2019-09-05 DIAGNOSIS — R7989 Other specified abnormal findings of blood chemistry: Secondary | ICD-10-CM

## 2019-09-05 DIAGNOSIS — K219 Gastro-esophageal reflux disease without esophagitis: Secondary | ICD-10-CM

## 2019-09-05 DIAGNOSIS — Z9049 Acquired absence of other specified parts of digestive tract: Secondary | ICD-10-CM

## 2019-09-05 DIAGNOSIS — M546 Pain in thoracic spine: Secondary | ICD-10-CM

## 2019-09-05 DIAGNOSIS — M542 Cervicalgia: Secondary | ICD-10-CM | POA: Diagnosis not present

## 2019-09-05 DIAGNOSIS — R519 Headache, unspecified: Secondary | ICD-10-CM

## 2019-09-05 DIAGNOSIS — G8929 Other chronic pain: Secondary | ICD-10-CM

## 2019-09-05 DIAGNOSIS — R0789 Other chest pain: Secondary | ICD-10-CM

## 2019-09-05 MED ORDER — PANTOPRAZOLE SODIUM 40 MG PO TBEC: 40.0000 mg | DELAYED_RELEASE_TABLET | Freq: Every day | ORAL | 3 refills | Status: DC

## 2019-09-05 MED ORDER — CYCLOBENZAPRINE HCL 5 MG PO TABS: 5.0000 mg | ORAL_TABLET | Freq: Three times a day (TID) | ORAL | 1 refills | Status: DC | PRN

## 2019-09-05 NOTE — Progress Notes (Signed)
Name: William Dennis   MRN: [id number]    DOB: 1985-09-14   Date:09/05/2019       Progress Note  Chief Complaint  Patient presents with  . New Patient (Initial Visit)  . Migraine    up to 1 to 2 a week lasting all day.  . Panic Attack    onset 5 years, random sob, chest pain has been checked out several times including rule out of heart.     Subjective:   William Dennis is a 34 y.o. male, presents to clinic to establish care  Hx of intermittent CP and SOB, worked up multiple times in the past with acute visits/ER visits, has had neg work ups since then.  Some visits in the past year with back pain and abd pain, otherwise pt states he has been fairly healthy  Still having CP episodes, multiple neg CP work ups in ER, with recommended stress tests, which he hasn't done yet. Sx randomly have come when at rest or working, sudden onset severe CP like a pressure across entire upper chest and to back, used to happen once every few months would last for 7-8 min, severe, associated with hot sweats, feels like has normal HR, no N/V palpitations, HA, visual changes, near syncope or syncope  Episodes still occurring, they happen more often, but less duration each time, and he reports onset seems to be completely random, not related to eating, time of day, positions, exertion.  Hx of "bad acid reflux" but not currently treating, previously used zantac, and then when recalled tried to switch to pepcid but did not do well with it.  GERD sx - has bloating, indigestion, gassy, triggered by spicy foods, [place] food, no dysphagia no abd pain Rare use of NSAIDs,  S/P cholecystectomy diarrhea with most foods "runs right through him"  Grandfather died of MI in his 11's, no other immediate family hx of early MI, stroke, arrhythmia or sudden cardiac  Also hx of some neck and back pain, intermittent Hx of migraines more frequent, having more lately with back pain and more on his days off, he has sensitive  eyes.   Patient Active Problem List   Diagnosis Date Noted  . Common bile duct leak   . S/P cholecystectomy 08/27/2018  . Acute cholecystitis 08/25/2018    Past Surgical History:  Procedure Laterality Date  . CHOLECYSTECTOMY N/A 08/26/2018   Procedure: LAPAROSCOPIC CHOLECYSTECTOMY changed to open;  Surgeon: Sung Amabile, DO;  Location: [facility] ORS;  Service: General;  Laterality: N/A;  . CHOLECYSTECTOMY  08/26/2018   Procedure: CHOLECYSTECTOMY;  Surgeon: Sung Amabile, DO;  Location: [facility] ORS;  Service: General;;  . ERCP N/A 08/28/2018   Procedure: ENDOSCOPIC RETROGRADE CHOLANGIOPANCREATOGRAPHY (ERCP);  Surgeon: Midge Minium, MD;  Location: [facility] ENDOSCOPY;  Service: Endoscopy;  Laterality: N/A;    Family History  Problem Relation Age of Onset  . Depression Mother   . Anxiety disorder Mother   . Diabetes Father   . Heart disease Father   . Bleeding Disorder Father   . Hyperlipidemia Father   . Hypertension Father   . Diabetes Sister   . Depression Sister   . Heart attack Maternal Grandfather   . Breast cancer Paternal Grandmother   . Depression Sister   . Anxiety disorder Sister     Social History   Tobacco Use  . Smoking status: Never Smoker  . Smokeless tobacco: Never Used  Substance Use Topics  . Alcohol use: Yes    Comment: social  .  Drug use: Never      Current Outpatient Medications:  .  ibuprofen (ADVIL,MOTRIN) 800 MG tablet, Take 1 tablet (800 mg total) by mouth every 8 (eight) hours as needed for mild pain or moderate pain. (Patient not taking: Reported on 09/27/2018), Disp: 30 tablet, Rfl: 0 .  omeprazole (PRILOSEC OTC) 20 MG tablet, Take 20 mg by mouth daily., Disp: , Rfl:   Allergies  Allergen Reactions  . Codeine Anxiety    Chart Review Today: I personally reviewed active problem list, medication list, allergies, family history, social history, health maintenance, notes from last encounter, lab results, imaging with the patient/caregiver today.  Review  of Systems  Constitutional: Negative.  Negative for activity change, appetite change, chills, diaphoresis, fatigue and fever.  HENT: Negative.   Eyes: Negative.   Respiratory: Negative.   Cardiovascular: Negative.   Gastrointestinal: Negative.   Endocrine: Negative.   Genitourinary: Negative.   Musculoskeletal: Negative.   Skin: Negative.   Allergic/Immunologic: Negative.   Neurological: Positive for headaches. Negative for seizures, syncope, weakness and light-headedness.  Hematological: Negative.   Psychiatric/Behavioral: Negative for decreased concentration, sleep disturbance and suicidal ideas. The patient is nervous/anxious.   All other systems reviewed and are negative.    Objective:    Vitals:   09/05/19 1026  BP: 130/88  Pulse: 96  Resp: 14  Temp: 98.7 F (37.1 C)  SpO2: 98%  Weight: 224 lb 12.8 oz (102 kg)  Height: 5\' 11"  (1.803 m)    Body mass index is 31.35 kg/m.  Physical Exam Vitals and nursing note reviewed.  Constitutional:      General: He is not in acute distress.    Appearance: Normal appearance. He is well-developed. He is not ill-appearing, toxic-appearing or diaphoretic.     Interventions: Face mask in place.  HENT:     Head: Normocephalic and atraumatic.     Jaw: No trismus.     Right Ear: External ear normal.     Left Ear: External ear normal.  Eyes:     General: Lids are normal. No scleral icterus.    Conjunctiva/sclera: Conjunctivae normal.     Pupils: Pupils are equal, round, and reactive to light.  Neck:     Trachea: Trachea and phonation normal. No tracheal deviation.  Cardiovascular:     Rate and Rhythm: Normal rate and regular rhythm.     Pulses: Normal pulses.          Radial pulses are 2+ on the right side and 2+ on the left side.       Posterior tibial pulses are 2+ on the right side and 2+ on the left side.     Heart sounds: Normal heart sounds. No murmur. No friction rub. No gallop.   Pulmonary:     Effort: Pulmonary  effort is normal. No respiratory distress.     Breath sounds: Normal breath sounds. No stridor. No wheezing, rhonchi or rales.  Abdominal:     General: Bowel sounds are normal. There is no distension.     Palpations: Abdomen is soft.     Tenderness: There is no abdominal tenderness. There is no right CVA tenderness, left CVA tenderness, guarding or rebound.  Musculoskeletal:        General: Normal range of motion.     Cervical back: Normal range of motion and neck supple.     Right lower leg: No edema.     Left lower leg: No edema.  Skin:    General: Skin  is warm and dry.     Capillary Refill: Capillary refill takes less than 2 seconds.     Coloration: Skin is not jaundiced.     Findings: No rash.     Nails: There is no clubbing.  Neurological:     Mental Status: He is alert.     Cranial Nerves: No dysarthria or facial asymmetry.     Motor: No tremor or abnormal muscle tone.     Gait: Gait normal.  Psychiatric:        Mood and Affect: Mood normal.        Speech: Speech normal.        Behavior: Behavior normal. Behavior is cooperative.      PHQ2/9: Depression screen [facility] 2/9 09/05/2019  Decreased Interest 0  Down, Depressed, Hopeless 0  PHQ - 2 Score 0  Altered sleeping 1  Tired, decreased energy 1  Change in appetite 0  Feeling bad or failure about yourself  0  Trouble concentrating 0  Moving slowly or fidgety/restless 0  Suicidal thoughts 0  PHQ-9 Score 2  Difficult doing work/chores Not difficult at all    phq 9 is mood good, some hx of anxiety  Fall Risk: Fall Risk  09/05/2019  Falls in the past year? 0  Number falls in past yr: 0  Injury with Fall? 0    Functional Status Survey: Is the patient deaf or have difficulty hearing?: No Does the patient have difficulty seeing, even when wearing glasses/contacts?: Yes Does the patient have difficulty concentrating, remembering, or making decisions?: No Does the patient have difficulty walking or climbing stairs?:  No Does the patient have difficulty dressing or bathing?: No Does the patient have difficulty doing errands alone such as visiting a doctor's office or shopping?: No   Assessment & Plan:        ICD-10-CM   1. Gastroesophageal reflux disease, unspecified whether esophagitis present  K21.9 pantoprazole (PROTONIX) 40 MG tablet    CBC with Differential/Platelet    COMPLETE METABOLIC PANEL WITH GFR   start tx with PPI, encouraged lifestyle/food changes, PPI trial for at least 2-4 weeks, H2 blocker as needed, can refer to GI if not improving  2. Atypical chest pain  R07.89 CBC with Differential/Platelet    COMPLETE METABOLIC PANEL WITH GFR    Lipid panel    TSH    Ambulatory referral to Cardiology   suspect it may be GERD/reflux or esophagitis sx, PPI trial, but cardiology f/up may be appropriate since pt has been referred multiple times in the past  3. Chronic neck pain  M54.2 Ambulatory referral to Physical Therapy   G89.29 cyclobenzaprine (FLEXERIL) 5 MG tablet   encouraged pt to be careful with NSAIDs, avoid if possible   4. Chronic thoracic back pain, unspecified back pain laterality  M54.6 Ambulatory referral to Physical Therapy   G89.29 cyclobenzaprine (FLEXERIL) 5 MG tablet   back pain - encouraged conservative measures, muscle relaxers, PT to eval and treat, avoid nsaids, use heating pad  5. Nonintractable episodic headache, unspecified headache type  R51.9    more frequent, no past work up, sounds suspicious for migraines - encouraged him to return to discuss more   6. S/P cholecystectomy  Z90.49    recent surgery with post-op complications  7. Elevated LFTs  S50.53 COMPLETE METABOLIC PANEL WITH GFR  8. Encounter to establish care with new doctor  Z76.89    reviewed available recent records and labs    Pt at time  of OV did not have any active CP - so no work up was initiated - no red flags, no exertional sx or concerns found with VS or physical exam.      Return for 2 month  follow up on CP GERD/med check.   Danelle Berry, PA-C 09/05/19 10:42 AM

## 2019-09-05 NOTE — Patient Instructions (Signed)
Return when you would like for visit for headache or other problems.   Take protonix for acid reflux and hope it will help with chest pain  Follow up with Cardiology and physical therapy  Try heat therapy, gentle stretching and muscle relaxers for neck pain  General Headache Without Cause A headache is pain or discomfort that is felt around the head or neck area. There are many causes and types of headaches. In some cases, the cause may not be found. Follow these instructions at home: Watch your condition for any changes. Let your doctor know about them. Take these steps to help with your condition: Managing pain      Take over-the-counter and prescription medicines only as told by your doctor.  Lie down in a dark, quiet room when you have a headache.  If told, put ice on your head and neck area: ? Put ice in a plastic bag. ? Place a towel between your skin and the bag. ? Leave the ice on for 20 minutes, 2-3 times per day.  If told, put heat on the affected area. Use the heat source that your doctor recommends, such as a moist heat pack or a heating pad. ? Place a towel between your skin and the heat source. ? Leave the heat on for 20-30 minutes. ? Remove the heat if your skin turns bright red. This is very important if you are unable to feel pain, heat, or cold. You may have a greater risk of getting burned.  Keep lights dim if bright lights bother you or make your headaches worse. Eating and drinking  Eat meals on a regular schedule.  If you drink alcohol: ? Limit how much you use to:  0-1 drink a day for women.  0-2 drinks a day for men. ? Be aware of how much alcohol is in your drink. In the [place] one drink equals one 12 oz bottle of beer (355 mL), one 5 oz glass of wine (148 mL), or one 1 oz glass of hard liquor (44 mL).  Stop drinking caffeine, or reduce how much caffeine you drink. General instructions   Keep a journal to find out if certain things bring on  headaches. For example, write down: ? What you eat and drink. ? How much sleep you get. ? Any change to your diet or medicines.  Get a massage or try other ways to relax.  Limit stress.  Sit up straight. Do not tighten (tense) your muscles.  Do not use any products that contain nicotine or tobacco. This includes cigarettes, e-cigarettes, and chewing tobacco. If you need help quitting, ask your doctor.  Exercise regularly as told by your doctor.  Get enough sleep. This often means 7-9 hours of sleep each night.  Keep all follow-up visits as told by your doctor. This is important. Contact a doctor if:  Your symptoms are not helped by medicine.  You have a headache that feels different than the other headaches.  You feel sick to your stomach (nauseous) or you throw up (vomit).  You have a fever. Get help right away if:  Your headache gets very bad quickly.  Your headache gets worse after a lot of physical activity.  You keep throwing up.  You have a stiff neck.  You have trouble seeing.  You have trouble speaking.  You have pain in the eye or ear.  Your muscles are weak or you lose muscle control.  You lose your balance or have trouble  walking.  You feel like you will pass out (faint) or you pass out.  You are mixed up (confused).  You have a seizure. Summary  A headache is pain or discomfort that is felt around the head or neck area.  There are many causes and types of headaches. In some cases, the cause may not be found.  Keep a journal to help find out what causes your headaches. Watch your condition for any changes. Let your doctor know about them.  Contact a doctor if you have a headache that is different from usual, or if your headache is not helped by medicine.  Get help right away if your headache gets very bad, you throw up, you have trouble seeing, you lose your balance, or you have a seizure. This information is not intended to replace advice  given to you by your health care provider. Make sure you discuss any questions you have with your health care provider. Document Revised: 01/16/2018 Document Reviewed: 01/16/2018 Elsevier Patient Education  [employer].   Form - Headache Record There are many types and causes of headaches. A headache record can help guide your treatment plan. Use this form to record the details. Bring this form with you to your follow-up visits. Follow your health care provider's instructions on how to describe your headache. You may be asked to:  Use a pain scale. This is a tool to rate the intensity of your headache using words or numbers.  Describe what your headache feels like, such as dull, achy, throbbing, or sharp. Headache record Date: _______________ Time (from start to end): ____________________ Location of the headache: _________________________  Intensity of the headache: ____________________ Description of the headache: ______________________________________________________________  Hours of sleep the night before the headache: __________  Food or drinks before the headache started: ______________________________________________________________________________________  Events before the headache started: _______________________________________________________________________________________________  Symptoms before the headache started: __________________________________________________________________________________________  Symptoms during the headache: __________________________________________________________________________________________________  Treatment: ________________________________________________________________________________________________________________  Effect of treatment: _________________________________________________________________________________________________________  Other comments:  ___________________________________________________________________________________________________________ Date: _______________ Time (from start to end): ____________________ Location of the headache: _________________________  Intensity of the headache: ____________________ Description of the headache: ______________________________________________________________  Hours of sleep the night before the headache: __________  Food or drinks before the headache started: ______________________________________________________________________________________  Events before the headache started: ____________________________________________________________________________________________  Symptoms before the headache started: _________________________________________________________________________________________  Symptoms during the headache: _______________________________________________________________________________________________  Treatment: ________________________________________________________________________________________________________________  Effect of treatment: _________________________________________________________________________________________________________  Other comments: ___________________________________________________________________________________________________________ Date: _______________ Time (from start to end): ____________________ Location of the headache: _________________________  Intensity of the headache: ____________________ Description of the headache: ______________________________________________________________  Hours of sleep the night before the headache: __________  Food or drinks before the headache started: ______________________________________________________________________________________  Events before the headache started: ____________________________________________________________________________________________  Symptoms  before the headache started: _________________________________________________________________________________________  Symptoms during the headache: _______________________________________________________________________________________________  Treatment: ________________________________________________________________________________________________________________  Effect of treatment: _________________________________________________________________________________________________________  Other comments: ___________________________________________________________________________________________________________ Date: _______________ Time (from start to end): ____________________ Location of the headache: _________________________  Intensity of the headache: ____________________ Description of the headache: ______________________________________________________________  Hours of sleep the night before the headache: _________  Food or drinks before the headache started: ______________________________________________________________________________________  Events before the headache started: ____________________________________________________________________________________________  Symptoms before the headache started: _________________________________________________________________________________________  Symptoms during the headache: _______________________________________________________________________________________________  Treatment: ________________________________________________________________________________________________________________  Effect of treatment: _________________________________________________________________________________________________________  Other comments: ___________________________________________________________________________________________________________ Date: _______________ Time (from start to end): ____________________ Location of  the headache: _________________________  Intensity of the headache: ____________________ Description of the headache: ______________________________________________________________  Hours of sleep  the night before the headache: _________  Food or drinks before the headache started: ______________________________________________________________________________________  Events before the headache started: ____________________________________________________________________________________________  Symptoms before the headache started: _________________________________________________________________________________________  Symptoms during the headache: _______________________________________________________________________________________________  Treatment: ________________________________________________________________________________________________________________  Effect of treatment: _________________________________________________________________________________________________________  Other comments: ___________________________________________________________________________________________________________ This information is not intended to replace advice given to you by your health care provider. Make sure you discuss any questions you have with your health care provider. Document Revised: 07/17/2018 Document Reviewed: 07/17/2018 Elsevier Patient Education  2020 [employer].

## 2019-09-06 LAB — CBC WITH DIFFERENTIAL/PLATELET
Absolute Monocytes: 785 cells/uL (ref 200–950)
Basophils Absolute: 58 cells/uL (ref 0–200)
Basophils Relative: 0.8 %
Eosinophils Absolute: 122 cells/uL (ref 15–500)
Eosinophils Relative: 1.7 %
HCT: 51.1 % — ABNORMAL HIGH (ref 38.5–50.0)
Hemoglobin: 17.5 g/dL — ABNORMAL HIGH (ref 13.2–17.1)
Lymphs Abs: 2282 cells/uL (ref 850–3900)
MCH: 30.4 pg (ref 27.0–33.0)
MCHC: 34.2 g/dL (ref 32.0–36.0)
MCV: 88.7 fL (ref 80.0–100.0)
MPV: 12.6 fL — ABNORMAL HIGH (ref 7.5–12.5)
Monocytes Relative: 10.9 %
Neutro Abs: 3953 cells/uL (ref 1500–7800)
Neutrophils Relative %: 54.9 %
Platelets: 209 10*3/uL (ref 140–400)
RBC: 5.76 10*6/uL (ref 4.20–5.80)
RDW: 12.8 % (ref 11.0–15.0)
Total Lymphocyte: 31.7 %
WBC: 7.2 10*3/uL (ref 3.8–10.8)

## 2019-09-06 LAB — COMPLETE METABOLIC PANEL WITH GFR
AG Ratio: 2 (calc) (ref 1.0–2.5)
ALT: 77 U/L — ABNORMAL HIGH (ref 9–46)
AST: 32 U/L (ref 10–40)
Albumin: 5 g/dL (ref 3.6–5.1)
Alkaline phosphatase (APISO): 79 U/L (ref 36–130)
BUN: 12 mg/dL (ref 7–25)
CO2: 28 mmol/L (ref 20–32)
Calcium: 10.1 mg/dL (ref 8.6–10.3)
Chloride: 103 mmol/L (ref 98–110)
Creat: 1.1 mg/dL (ref 0.60–1.35)
GFR, Est African American: 102 mL/min/{1.73_m2} (ref >=60)
GFR, Est Non African American: 88 mL/min/{1.73_m2} (ref >=60)
Globulin: 2.5 g/dL (calc) (ref 1.9–3.7)
Glucose, Bld: 74 mg/dL (ref 65–99)
Potassium: 4.2 mmol/L (ref 3.5–5.3)
Sodium: 140 mmol/L (ref 135–146)
Total Bilirubin: 0.7 mg/dL (ref 0.2–1.2)
Total Protein: 7.5 g/dL (ref 6.1–8.1)

## 2019-09-06 LAB — TSH: TSH: 1.3 mIU/L (ref 0.40–4.50)

## 2019-09-06 LAB — LIPID PANEL
Cholesterol: 152 mg/dL (ref <200)
HDL: 42 mg/dL (ref >=40)
LDL Cholesterol (Calc): 89 mg/dL (calc)
Non-HDL Cholesterol (Calc): 110 mg/dL (calc) (ref <130)
Total CHOL/HDL Ratio: 3.6 (calc) (ref <5.0)
Triglycerides: 109 mg/dL (ref <150)

## 2019-09-12 ENCOUNTER — Ambulatory Visit (INDEPENDENT_AMBULATORY_CARE_PROVIDER_SITE_OTHER): Payer: BC Managed Care – PPO | Admitting: Internal Medicine

## 2019-09-12 ENCOUNTER — Encounter: Payer: Self-pay | Admitting: Internal Medicine

## 2019-09-12 ENCOUNTER — Other Ambulatory Visit: Payer: Self-pay

## 2019-09-12 VITALS — BP 110/80 | HR 78 | Ht 71.0 in | Wt 226.5 lb

## 2019-09-12 DIAGNOSIS — R0789 Other chest pain: Secondary | ICD-10-CM | POA: Diagnosis not present

## 2019-09-12 NOTE — Progress Notes (Signed)
New Outpatient Visit Date: 09/12/2019  Referring Provider: Danelle Berry, PA-C [address] [place],  [place] [address]  Chief Complaint: Chest pain  HPI:  William Dennis is a 34 y.o. male who is being seen today for the evaluation of chest pain at the request of Danelle Berry, PA-C. He has a history of cholecystitis status post cholecystectomy complicated by bile leak, GERD, and anxiety.  He has been seen multiple times in the ED for chest and abdominal pain.  At one point several years ago, stress test was recommended, though he never proceeded with this.  Today, William Dennis reports that he is doing well.  He notes that his first episode of chest pain occurred approximately 5 years ago while at work.  He had a sharp pain in the center of his chest extending to his back with associated shortness of breath.  He had a coworker take him to the emergency department, but by the time he arrived there, the pain had resolved.  ED work-up was negative.  He continues to have random chest pains, which typically last 5 to 7 minutes and resolve spontaneously.  They happen every few months and are not related to any particular activity, including exertion.  He continues to note associated shortness of breath as well as a hot feeling.  He was started on pantoprazole last week and notes that he feels less bloated.  He has not had any further episodes of chest pain, though as noted above, they happen fairly frequently so it is difficult to know if pantoprazole has helped.  William Dennis reports having undergone an echocardiogram a few years ago, likely at [facility].  He has never undergone ischemia testing.  He denies palpitations, lightheadedness, and edema.  --------------------------------------------------------------------------------------------------  Cardiovascular History & Procedures: Cardiovascular Problems:  Chest pain  Risk Factors:  Male gender and  obesity  Cath/PCI:  None  CV Surgery:  None  EP Procedures and Devices:  None  Non-Invasive Evaluation(s):  None  Recent CV Pertinent Labs: Lab Results  Component Value Date   CHOL 152 09/05/2019   HDL 42 09/05/2019   LDLCALC 89 09/05/2019   TRIG 109 09/05/2019   CHOLHDL 3.6 09/05/2019   K 4.2 09/05/2019   MG 2.0 08/30/2018   BUN 12 09/05/2019   CREATININE 1.10 09/05/2019    --------------------------------------------------------------------------------------------------  Past Medical History:  Diagnosis Date  . Anxiety   . Cholecystitis   . Migraines     Past Surgical History:  Procedure Laterality Date  . CHOLECYSTECTOMY N/A 08/26/2018   Procedure: LAPAROSCOPIC CHOLECYSTECTOMY changed to open;  Surgeon: Sung Amabile, DO;  Location: [facility] ORS;  Service: General;  Laterality: N/A;  . CHOLECYSTECTOMY  08/26/2018   Procedure: CHOLECYSTECTOMY;  Surgeon: Sung Amabile, DO;  Location: [facility] ORS;  Service: General;;  . ERCP N/A 08/28/2018   Procedure: ENDOSCOPIC RETROGRADE CHOLANGIOPANCREATOGRAPHY (ERCP);  Surgeon: Midge Minium, MD;  Location: [facility] ENDOSCOPY;  Service: Endoscopy;  Laterality: N/A;    Current Meds  Medication Sig  . ibuprofen (ADVIL,MOTRIN) 800 MG tablet Take 1 tablet (800 mg total) by mouth every 8 (eight) hours as needed for mild pain or moderate pain.  . pantoprazole (PROTONIX) 40 MG tablet Take 40 mg by mouth daily.    Allergies: Codeine  Social History   Tobacco Use  . Smoking status: Never Smoker  . Smokeless tobacco: Never Used  Substance Use Topics  . Alcohol use: Not Currently    Comment: Drinks once every few months  .  Drug use: Never    Family History  Problem Relation Age of Onset  . Depression Mother   . Anxiety disorder Mother   . Diabetes Father   . Bleeding Disorder Father   . Hyperlipidemia Father   . Hypertension Father   . Diabetes Sister   . Depression Sister   . Heart attack Maternal Grandfather 70  . Breast  cancer Paternal Grandmother   . Depression Sister   . Anxiety disorder Sister     Review of Systems: A 12-system review of systems was performed and was negative except as noted in the HPI.  --------------------------------------------------------------------------------------------------  Physical Exam: BP 110/80 (BP Location: Right Arm, Patient Position: Sitting, Cuff Size: Normal)   Pulse 78   Ht 5\' 11"  (1.803 m)   Wt 226 lb 8 oz (102.7 kg)   SpO2 98%   BMI 31.59 kg/m   General: NAD.  Seated comfortably in the exam room. HEENT: No conjunctival pallor or scleral icterus. Facemask in place. Neck: Supple without lymphadenopathy, thyromegaly, JVD, or HJR. No carotid bruit. Lungs: Normal work of breathing. Clear to auscultation bilaterally without wheezes or crackles. Heart: Regular rate and rhythm without murmurs, rubs, or gallops. Non-displaced PMI. Abd: Bowel sounds present. Soft, NT/ND without hepatosplenomegaly Ext: No lower extremity edema. Radial, PT, and DP pulses are 2+ bilaterally Skin: Warm and dry without rash. Neuro: CNIII-XII intact. Strength and fine-touch sensation intact in upper and lower extremities bilaterally. Psych: Normal mood and affect.  EKG: Normal sinus rhythm with borderline LVH.  Lab Results  Component Value Date   WBC 7.2 09/05/2019   HGB 17.5 (H) 09/05/2019   HCT 51.1 (H) 09/05/2019   MCV 88.7 09/05/2019   PLT 209 09/05/2019    Lab Results  Component Value Date   NA 140 09/05/2019   K 4.2 09/05/2019   CL 103 09/05/2019   CO2 28 09/05/2019   BUN 12 09/05/2019   CREATININE 1.10 09/05/2019   GLUCOSE 74 09/05/2019   ALT 77 (H) 09/05/2019    Lab Results  Component Value Date   CHOL 152 09/05/2019   HDL 42 09/05/2019   LDLCALC 89 09/05/2019   TRIG 109 09/05/2019   CHOLHDL 3.6 09/05/2019     --------------------------------------------------------------------------------------------------  ASSESSMENT AND PLAN: Atypical chest  pain: Chest pain has been present for at least 5 years and is not exertional.  Exam today is unremarkable.  EKG shows sinus rhythm with borderline LVH and small q-waves in the inferior leads.  I am most suspicious for a noncardiac etiology of William Dennis chest pain, given his young age and minimal cardiac risk factors.  We discussed the utility of noninvasive ischemia testing and have agreed to defer this to see if recent addition of pantoprazole prevents further chest pain.  We will request records from prior echocardiogram at [facility].  If we are unable to obtain the records, I think it would be prudent to obtain an echocardiogram, given findings of borderline LVH on EKG today and intermittent chest pain.  If he has further chest pain despite regular use of pantoprazole, we will readdress proceeding with exercise tolerance test in the future.  Follow-up: Return to clinic in 3 months.  Nelva Bush, MD 09/12/2019 10:06 PM

## 2019-09-12 NOTE — Patient Instructions (Signed)
Medication Instructions:  Your physician recommends that you continue on your current medications as directed. Please refer to the Current Medication list given to you today.  *If you need a refill on your cardiac medications before your next appointment, please call your pharmacy*   Lab Work: None If you have labs (blood work) drawn today and your tests are completely normal, you will receive your results only by: Marland Kitchen MyChart Message (if you have MyChart) OR . A paper copy in the mail If you have any lab test that is abnormal or we need to change your treatment, we will call you to review the results.   Testing/Procedures:  We will have you sign a release form to get echo results from [facility].    Follow-Up: At [facility], you and your health needs are our priority.  As part of our continuing mission to provide you with exceptional heart care, we have created designated Provider Care Teams.  These Care Teams include your primary Cardiologist (physician) and Advanced Practice Providers (APPs -  Physician Assistants and Nurse Practitioners) who all work together to provide you with the care you need, when you need it.  We recommend signing up for the patient portal called "MyChart".  Sign up information is provided on this After Visit Summary.  MyChart is used to connect with patients for Virtual Visits (Telemedicine).  Patients are able to view lab/test results, encounter notes, upcoming appointments, etc.  Non-urgent messages can be sent to your provider as well.   To learn more about what you can do with MyChart, go to [email or web address].    Your next appointment:   3 month(s)  The format for your next appointment:   In Person  Provider:    You may see Dr Cristal Deer End or one of the following Advanced Practice Providers on your designated Care Team:    Nicolasa Ducking, NP  Eula Listen, PA-C  Marisue Ivan, PA-C

## 2019-10-03 ENCOUNTER — Ambulatory Visit: Payer: BC Managed Care – PPO | Attending: Family Medicine

## 2019-11-05 ENCOUNTER — Ambulatory Visit: Payer: BC Managed Care – PPO | Admitting: Family Medicine

## 2019-11-15 ENCOUNTER — Ambulatory Visit: Payer: BC Managed Care – PPO | Admitting: Family Medicine

## 2019-12-13 ENCOUNTER — Ambulatory Visit: Payer: BC Managed Care – PPO | Admitting: Internal Medicine

## 2019-12-18 ENCOUNTER — Other Ambulatory Visit: Payer: Self-pay

## 2019-12-18 MED ORDER — PANTOPRAZOLE SODIUM 40 MG PO TBEC: 40.0000 mg | DELAYED_RELEASE_TABLET | Freq: Every day | ORAL | 3 refills | Status: DC

## 2019-12-24 ENCOUNTER — Ambulatory Visit: Payer: BC Managed Care – PPO | Admitting: Family Medicine

## 2020-01-02 ENCOUNTER — Encounter: Payer: Self-pay | Admitting: Family Medicine

## 2020-01-02 ENCOUNTER — Telehealth (INDEPENDENT_AMBULATORY_CARE_PROVIDER_SITE_OTHER): Payer: BC Managed Care – PPO | Admitting: Family Medicine

## 2020-01-02 VITALS — Ht 72.0 in | Wt 207.0 lb

## 2020-01-02 DIAGNOSIS — M542 Cervicalgia: Secondary | ICD-10-CM

## 2020-01-02 DIAGNOSIS — R0789 Other chest pain: Secondary | ICD-10-CM

## 2020-01-02 DIAGNOSIS — K219 Gastro-esophageal reflux disease without esophagitis: Secondary | ICD-10-CM

## 2020-01-02 DIAGNOSIS — M546 Pain in thoracic spine: Secondary | ICD-10-CM | POA: Diagnosis not present

## 2020-01-02 DIAGNOSIS — R7989 Other specified abnormal findings of blood chemistry: Secondary | ICD-10-CM

## 2020-01-02 DIAGNOSIS — G8929 Other chronic pain: Secondary | ICD-10-CM

## 2020-01-02 MED ORDER — CYCLOBENZAPRINE HCL 5 MG PO TABS: 5.0000 mg | ORAL_TABLET | Freq: Three times a day (TID) | ORAL | 0 refills | Status: DC | PRN

## 2020-01-02 NOTE — Progress Notes (Signed)
Name: William Dennis   MRN: [id number]    DOB: 1985-08-08   Date:01/02/2020       Progress Note  Subjective:    I connected with  William Dennis  on 01/02/20 at  9:20 AM EDT by a video enabled telemedicine application and verified that I am speaking with the correct person using two identifiers.  I discussed the limitations of evaluation and management by telemedicine and the availability of in person appointments. The patient expressed understanding and agreed to proceed. Staff also discussed with the patient that there may be a patient responsible charge related to this service. Patient Location: home Provider Location: [facility]  Additional Individuals present: none  Chief Complaint  Patient presents with  . Follow-up  . Gastroesophageal Reflux  . Medication Refill    muscle relaxer for neck and back pain    William Dennis is a 34 y.o. male, presents for virtual visit for routine follow up on the conditions listed above.  GERD: Significant improvement on sx, no more chest pain or "spasms" he is still taking protonix most days, but forgets occasionally, without significant flares of sx.   - Current medication regimen: protonix 40 mg  - Possible Triggers: still some food triggers - Denies: abdominal pain, chest pain, cough, wheezing, weight loss, dysphagia, black stools, hematemesis, diarrhea, constipation, fever, history of PUD or history of GI bleeding  Back and neck pain - has been getting better with loosing some weight and working out more.  Has occasional pain here or there, did want a refill on flexeril for prn use.  Not using more than a few times a month.  He isn't using other OTC meds often.  Denies any radiation of pain, numbness, tingling.  Feels its improving  Reviewed Cardiology visit with Dr. Okey Dupre with pt - Dr. Okey Dupre had recommended 3 month f/up and additional testing if CP did not improve with PPI - pt has no appt set up, does not plan to go back with improving sx,  encouraged him to f/up and communicate with office improved sx, Dr. Okey Dupre may want to see routinely with LVH and some minor abnormalities, or want to see only if sx return - defer to cardiologist  Patient Active Problem List   Diagnosis Date Noted  . Atypical chest pain 09/12/2019  . Common bile duct leak   . S/P cholecystectomy 08/27/2018  . Acute cholecystitis 08/25/2018    Current Outpatient Medications:  .  cyclobenzaprine (FLEXERIL) 5 MG tablet, Take 1-2 tablets (5-10 mg total) by mouth 3 (three) times daily as needed for muscle spasms., Disp: 30 tablet, Rfl: 1 .  ibuprofen (ADVIL,MOTRIN) 800 MG tablet, Take 1 tablet (800 mg total) by mouth every 8 (eight) hours as needed for mild pain or moderate pain., Disp: 30 tablet, Rfl: 0 .  pantoprazole (PROTONIX) 40 MG tablet, Take 1 tablet (40 mg total) by mouth daily., Disp: 90 tablet, Rfl: 3 Allergies  Allergen Reactions  . Codeine Anxiety    Past Surgical History:  Procedure Laterality Date  . CHOLECYSTECTOMY N/A 08/26/2018   Procedure: LAPAROSCOPIC CHOLECYSTECTOMY changed to open;  Surgeon: Sung Amabile, DO;  Location: [facility] ORS;  Service: General;  Laterality: N/A;  . CHOLECYSTECTOMY  08/26/2018   Procedure: CHOLECYSTECTOMY;  Surgeon: Sung Amabile, DO;  Location: [facility] ORS;  Service: General;;  . ERCP N/A 08/28/2018   Procedure: ENDOSCOPIC RETROGRADE CHOLANGIOPANCREATOGRAPHY (ERCP);  Surgeon: Midge Minium, MD;  Location: [facility] ENDOSCOPY;  Service: Endoscopy;  Laterality: N/A;   Family  History  Problem Relation Age of Onset  . Depression Mother   . Anxiety disorder Mother   . Diabetes Father   . Bleeding Disorder Father   . Hyperlipidemia Father   . Hypertension Father   . Diabetes Sister   . Depression Sister   . Heart attack Maternal Grandfather 70  . Breast cancer Paternal Grandmother   . Depression Sister   . Anxiety disorder Sister    Social History   Socioeconomic History  . Marital status: Significant Other    Spouse  name: Not on file  . Number of children: 3  . Years of education: 86  . Highest education level: Bachelor's degree (e.g., BA, AB, BS)  Occupational History  . Not on file  Tobacco Use  . Smoking status: Never Smoker  . Smokeless tobacco: Never Used  Vaping Use  . Vaping Use: Never used  Substance and Sexual Activity  . Alcohol use: Not Currently    Comment: Drinks once every few months  . Drug use: Never  . Sexual activity: Yes  Other Topics Concern  . Not on file  Social History Narrative  . Not on file   Social Determinants of Health   Financial Resource Strain:   . Difficulty of Paying Living Expenses:   Food Insecurity:   . Worried About Charity fundraiser in the Last Year:   . Arboriculturist in the Last Year:   Transportation Needs:   . Film/video editor (Medical):   Marland Kitchen Lack of Transportation (Non-Medical):   Physical Activity:   . Days of Exercise per Week:   . Minutes of Exercise per Session:   Stress:   . Feeling of Stress :   Social Connections:   . Frequency of Communication with Friends and Family:   . Frequency of Social Gatherings with Friends and Family:   . Attends Religious Services:   . Active Member of Clubs or Organizations:   . Attends Archivist Meetings:   Marland Kitchen Marital Status:   Intimate Partner Violence:   . Fear of Current or Ex-Partner:   . Emotionally Abused:   Marland Kitchen Physically Abused:   . Sexually Abused:     Chart Review Today: I personally reviewed active problem list, medication list, allergies, family history, social history, health maintenance, notes from last encounter, lab results, imaging with the patient/caregiver today.   Review of Systems  10 Systems reviewed and are negative for acute change except as noted in the HPI.   Objective:    Virtual encounter, vitals limited, only able to obtain the following Today's Vitals   01/02/20 0919  Weight: 207 lb (93.9 kg)  Height: 6' (1.829 m)   Body mass index is  28.07 kg/m. Nursing Note and Vital Signs reviewed.  Physical Exam Vitals and nursing note reviewed.  Constitutional:      General: He is not in acute distress.    Appearance: Normal appearance. He is well-developed. He is not ill-appearing, toxic-appearing or diaphoretic.  HENT:     Head: Normocephalic and atraumatic.     Right Ear: External ear normal.     Left Ear: External ear normal.     Nose: Nose normal.  Eyes:     General:        Right eye: No discharge.        Left eye: No discharge.     Conjunctiva/sclera: Conjunctivae normal.  Neck:     Trachea: No tracheal deviation.  Pulmonary:  Effort: Pulmonary effort is normal. No respiratory distress.     Breath sounds: No stridor.  Musculoskeletal:        General: Normal range of motion.     Cervical back: Normal range of motion.  Skin:    Coloration: Skin is not jaundiced or pale.     Findings: No rash.  Neurological:     Mental Status: He is alert. Mental status is at baseline.  Psychiatric:        Attention and Perception: Attention normal.        Mood and Affect: Mood normal.        Speech: Speech normal.        Behavior: Behavior normal. Behavior is cooperative.     PE limited by telephone encounter  No results found for this or any previous visit (from the past 72 hour(s)).  PHQ2/9: Depression screen [facility] 2/9 01/02/2020 09/05/2019  Decreased Interest 0 0  Down, Depressed, Hopeless 0 0  PHQ - 2 Score 0 0  Altered sleeping 0 1  Tired, decreased energy 0 1  Change in appetite 0 0  Feeling bad or failure about yourself  0 0  Trouble concentrating 0 0  Moving slowly or fidgety/restless 0 0  Suicidal thoughts 0 0  PHQ-9 Score 0 2  Difficult doing work/chores Not difficult at all Not difficult at all   PHQ-2/9 Result is neg, reviewed  Fall Risk: Fall Risk  01/02/2020 09/05/2019  Falls in the past year? 0 0  Number falls in past yr: 0 0  Injury with Fall? 0 0     Assessment and Plan:     ICD-10-CM     1. Gastroesophageal reflux disease, unspecified whether esophagitis present  K21.9    Improving symptoms with no recurrence of his chest pain, wean off PPI with use of pepcid BID PRN, avoid food triggers, f/up if unable to d/c ppi, refer to GI  2. Chronic neck pain  M54.2 cyclobenzaprine (FLEXERIL) 5 MG tablet   G89.29    Improving, refill sent on Flexeril 5 mg patient instructed to follow-up if worsening or using Flexeril frequently would be eval, PT and/or imaging  3. Chronic thoracic back pain, unspecified back pain laterality  M54.6 cyclobenzaprine (FLEXERIL) 5 MG tablet   G89.29    Same as #2, improving symptoms, no red flags, he has lost weight and has become more active, continue to monitor  4. Atypical chest pain  R07.89    Resolved, no recurrence of chest pain or spasms in his chest, patient to communicate and follow-up with cardiology, reviewed last office visit plan and EKG w/pt  5. Elevated LFTs  R79.89    Elevated with labs from February we will recheck at his next routine follow-up, may have improved with diet exercise and weight loss    I discussed the assessment and treatment plan with the patient. The patient was provided an opportunity to ask questions and all were answered. The patient agreed with the plan and demonstrated an understanding of the instructions.  The patient was advised to call back or seek an in-person evaluation if the symptoms worsen or if the condition fails to improve as anticipated.  I provided ~30 minutes of non-face-to-face time during this encounter. Roughly 15 minutes on virtual encounter with the patient and roughly 15 minutes to review chart, last office visit here, cardiology consult, EKG, last lab results, and to complete chart documentation today.  Danelle Berry, PA-C 01/02/20 9:42 AM

## 2020-03-31 ENCOUNTER — Telehealth: Payer: Self-pay | Admitting: Family Medicine

## 2020-03-31 NOTE — Telephone Encounter (Signed)
Patient is calling to let Sheliah Mends know per the instruction on COVID testing that he testing positive for COVID [phone]

## 2020-04-04 ENCOUNTER — Encounter: Payer: Self-pay | Admitting: Family Medicine

## 2020-04-04 ENCOUNTER — Other Ambulatory Visit: Payer: Self-pay

## 2020-04-04 ENCOUNTER — Telehealth: Payer: Self-pay

## 2020-04-04 ENCOUNTER — Telehealth (INDEPENDENT_AMBULATORY_CARE_PROVIDER_SITE_OTHER): Payer: BC Managed Care – PPO | Admitting: Family Medicine

## 2020-04-04 VITALS — Ht 71.0 in | Wt 207.0 lb

## 2020-04-04 DIAGNOSIS — K219 Gastro-esophageal reflux disease without esophagitis: Secondary | ICD-10-CM | POA: Diagnosis not present

## 2020-04-04 DIAGNOSIS — A0839 Other viral enteritis: Secondary | ICD-10-CM | POA: Diagnosis not present

## 2020-04-04 DIAGNOSIS — R110 Nausea: Secondary | ICD-10-CM

## 2020-04-04 DIAGNOSIS — U071 COVID-19: Secondary | ICD-10-CM | POA: Diagnosis not present

## 2020-04-04 MED ORDER — ONDANSETRON 4 MG PO TBDP: 4.0000 mg | ORAL_TABLET | Freq: Three times a day (TID) | ORAL | 1 refills | Status: DC | PRN

## 2020-04-04 NOTE — Telephone Encounter (Signed)
Patient will need virtual visit. 

## 2020-04-04 NOTE — Progress Notes (Signed)
Name: William Dennis   MRN: [id number]    DOB: [birth year]   Date:04/04/2020       Progress Note  Subjective:    Chief Complaint  Chief Complaint  Patient presents with  . Nausea    ongoing since dx with covid 2 weeks ago.  Has tried OTC meds    I connected with  [name]  on 04/04/20 at 11:20 AM EDT by a video enabled telemedicine application and verified that I am speaking with the correct person using two identifiers.  I discussed the limitations of evaluation and management by telemedicine and the availability of in person appointments. The patient expressed understanding and agreed to proceed. Staff also discussed with the patient that there may be a patient responsible charge related to this service. Patient Location: in his car Provider Location: [facility] Additional Individuals present: none  HPI COVID sx developed 03/22/2020 diarrhea nausea, was positive for COVID - was not evaluated anywhere but has been improving Yesterday he had a very bad day - N was very severe- very sx with movement or activity- causes dry heaving severe N/dry heaving His diarrhea has improved, he has no associated abd pain or cramping.  He is actually even able to tolerate eating and drinking and [place] not having any vomiting His GERD was also worse yesterday and though he has protonix he is not currently taking and has been using prn Tried otc meds like pepcid and pepto bismal    Patient Active Problem List   Diagnosis Date Noted  . Elevated LFTs 01/02/2020  . Gastroesophageal reflux disease 01/02/2020  . Chronic thoracic back pain 01/02/2020  . Chronic neck pain 01/02/2020  . Atypical chest pain 09/12/2019  . Common bile duct leak   . S/P cholecystectomy 08/27/2018  . Acute cholecystitis 08/25/2018    Social History   Tobacco Use  . Smoking status: Never Smoker  . Smokeless tobacco: Never Used  Substance Use Topics  . Alcohol use: Not Currently    Comment: Drinks once every  few months     Current Outpatient Medications:  .  cyclobenzaprine (FLEXERIL) 5 MG tablet, Take 1-2 tablets (5-10 mg total) by mouth 3 (three) times daily as needed for muscle spasms., Disp: 30 tablet, Rfl: 0 .  ibuprofen (ADVIL,MOTRIN) 800 MG tablet, Take 1 tablet (800 mg total) by mouth every 8 (eight) hours as needed for mild pain or moderate pain., Disp: 30 tablet, Rfl: 0 .  pantoprazole (PROTONIX) 40 MG tablet, Take 1 tablet (40 mg total) by mouth daily., Disp: 90 tablet, Rfl: 3 .  ondansetron (ZOFRAN ODT) 4 MG disintegrating tablet, Take 1 tablet (4 mg total) by mouth every 8 (eight) hours as needed for nausea or vomiting., Disp: 20 tablet, Rfl: 1  Allergies  Allergen Reactions  . Codeine Anxiety    I personally reviewed active problem list, medication list, allergies, family history, social history, health maintenance, notes from last encounter, lab results, imaging with the patient/caregiver today.   Review of Systems  10 Systems reviewed and are negative for acute change except as noted in the HPI.   Objective:   Virtual encounter, vitals limited, only able to obtain the following Today's Vitals   04/04/20 0940  Weight: 207 lb (93.9 kg)  Height: 5\' 11"  (1.803 m)   Body mass index is 28.87 kg/m. Nursing Note and Vital Signs reviewed.  Physical Exam Vitals and nursing note reviewed.  Constitutional:      General: He is not in  acute distress.    Appearance: He is not ill-appearing, toxic-appearing or diaphoretic.  Skin:    Coloration: Skin is not jaundiced or pale.  Neurological:     Mental Status: He is alert. Mental status is at baseline.  Psychiatric:        Mood and Affect: Mood normal.        Behavior: Behavior normal.     PE limited by telephone encounter  No results found for this or any previous visit (from the past 72 hour(s)).  Assessment and Plan:   1. Gastroenteritis due to COVID-19 virus Onset 13 d ago, most sx improving except intermittent  episodes of severe nausea and dry heaving Encouraged pt to push clear fluids, advance diet slowly, avoid fatty fried spicy foods - keep it bland  Trial of zofran prn, would expect him to continue to improve F/up if not improving in the next 2 weeks Pt denies abd pain, cramping, no other concerning red flags, he it tolerating po and never actually vomited, diarrhea resolving, pt appears well in video encounter today.  - ondansetron (ZOFRAN ODT) 4 MG disintegrating tablet; Take 1 tablet (4 mg total) by mouth every 8 (eight) hours as needed for nausea or vomiting.  Dispense: 20 tablet; Refill: 1  2. Nausea See #1 - ondansetron (ZOFRAN ODT) 4 MG disintegrating tablet; Take 1 tablet (4 mg total) by mouth every 8 (eight) hours as needed for nausea or vomiting.  Dispense: 20 tablet; Refill: 1  3. Gastroesophageal reflux disease, unspecified whether esophagitis present Pt has not been taking ppi as prescribed and with worsening sx has not restarted protonix. With recent viral illness may have some gastritis - I advised him to restart protonix daily in am on empty stomach to and to this for th next two weeks - worsening/uncontrolled GERD likely contributing to some of his post-covid GI sx    -Red flags and when to present for emergency care or RTC including fever >101.29F, chest pain, shortness of breath, new/worsening/un-resolving symptoms, reviewed with patient at time of visit. Follow up and care instructions discussed and provided in AVS. - I discussed the assessment and treatment plan with the patient. The patient was provided an opportunity to ask questions and all were answered. The patient agreed with the plan and demonstrated an understanding of the instructions.  I provided 20+ minutes of non-face-to-face time during this encounter.  Danelle Berry, PA-C 04/04/20 12:03 PM

## 2020-04-04 NOTE — Telephone Encounter (Signed)
Copied from CRM [id number]. Topic: General - Inquiry >> Apr 03, 2020  3:03 PM William Dennis wrote: Reason for CRM: Patient tested positive last week and the only symptom he cant shake is the constant nausea feeling followed by dry heaving, he wants to know if the doctor could send him in a prescription for nausea. He has tried several otc medications but none seem to work. He can be reached at [phone]. Please advise

## 2020-04-07 ENCOUNTER — Telehealth: Payer: BC Managed Care – PPO | Admitting: Family Medicine

## 2020-04-07 ENCOUNTER — Emergency Department: Payer: BC Managed Care – PPO

## 2020-04-07 ENCOUNTER — Other Ambulatory Visit: Payer: Self-pay

## 2020-04-07 ENCOUNTER — Emergency Department
Admission: EM | Admit: 2020-04-07 | Discharge: 2020-04-07 | Disposition: A | Discharge status: Home / Self Care | Payer: BC Managed Care – PPO | Attending: Emergency Medicine | Admitting: Emergency Medicine

## 2020-04-07 DIAGNOSIS — J1282 Pneumonia due to coronavirus disease 2019: Secondary | ICD-10-CM | POA: Insufficient documentation

## 2020-04-07 DIAGNOSIS — U071 COVID-19: Secondary | ICD-10-CM | POA: Insufficient documentation

## 2020-04-07 DIAGNOSIS — R0602 Shortness of breath: Secondary | ICD-10-CM | POA: Diagnosis not present

## 2020-04-07 DIAGNOSIS — R079 Chest pain, unspecified: Secondary | ICD-10-CM | POA: Diagnosis not present

## 2020-04-07 DIAGNOSIS — R05 Cough: Secondary | ICD-10-CM | POA: Diagnosis not present

## 2020-04-07 DIAGNOSIS — R0789 Other chest pain: Secondary | ICD-10-CM | POA: Diagnosis not present

## 2020-04-07 LAB — BASIC METABOLIC PANEL
Anion gap: 13 (ref 5–15)
BUN: 11 mg/dL (ref 6–20)
CO2: 25 mmol/L (ref 22–32)
Calcium: 8.6 mg/dL — ABNORMAL LOW (ref 8.9–10.3)
Chloride: 99 mmol/L (ref 98–111)
Creatinine, Ser: 1.12 mg/dL (ref 0.61–1.24)
GFR calc Af Amer: >60 mL/min (ref >60)
GFR calc non Af Amer: >60 mL/min (ref >60)
Glucose, Bld: 105 mg/dL — ABNORMAL HIGH (ref 70–99)
Potassium: 3.8 mmol/L (ref 3.5–5.1)
Sodium: 137 mmol/L (ref 135–145)

## 2020-04-07 LAB — CBC
HCT: 46.2 % (ref 39.0–52.0)
Hemoglobin: 16.3 g/dL (ref 13.0–17.0)
MCH: 29.7 pg (ref 26.0–34.0)
MCHC: 35.3 g/dL (ref 30.0–36.0)
MCV: 84.2 fL (ref 80.0–100.0)
Platelets: 171 10*3/uL (ref 150–400)
RBC: 5.49 MIL/uL (ref 4.22–5.81)
RDW: 12.7 % (ref 11.5–15.5)
WBC: 7.5 10*3/uL (ref 4.0–10.5)
nRBC: 0 % (ref 0.0–0.2)

## 2020-04-07 LAB — TROPONIN I (HIGH SENSITIVITY)
Troponin I (High Sensitivity): 3 ng/L (ref <18)
Troponin I (High Sensitivity): 4 ng/L (ref <18)

## 2020-04-07 LAB — FIBRIN DERIVATIVES D-DIMER (ARMC ONLY): Fibrin derivatives D-dimer (ARMC): 974.99 ng/mL (FEU) — ABNORMAL HIGH (ref 0.00–499.00)

## 2020-04-07 LAB — PROCALCITONIN: Procalcitonin: 0.12 ng/mL

## 2020-04-07 MED ORDER — IOHEXOL 350 MG/ML SOLN
75.0000 mL | Freq: Once | INTRAVENOUS | Status: AC | PRN
  Administered 2020-04-07: 75 mL via INTRAVENOUS

## 2020-04-07 MED ORDER — ALBUTEROL SULFATE HFA 108 (90 BASE) MCG/ACT IN AERS
2.0000 | INHALATION_SPRAY | Freq: Once | RESPIRATORY_TRACT | Status: AC
  Administered 2020-04-07: 2 via RESPIRATORY_TRACT
  Filled 2020-04-07: qty 6.7

## 2020-04-07 MED ORDER — PREDNISONE 20 MG PO TABS: 60.0000 mg | ORAL_TABLET | Freq: Every day | ORAL | 0 refills | Status: AC

## 2020-04-07 NOTE — ED Triage Notes (Signed)
Patient reports SOB, cough, flu-like symptoms X 14 days. Patient tested positive for COVID 10 days ago. Patient reports worsening of symptoms, especially SOB.

## 2020-04-07 NOTE — ED Provider Notes (Signed)
[facility] Emergency Department Provider Note   ____________________________________________   First MD Initiated Contact with Patient 04/07/20 1111     (approximate)  I have reviewed the triage vital signs and the nursing notes.   HISTORY  Chief Complaint Covid Positive    HPI William Dennis is a 34 y.o. male with past medical history of anxiety, migraines, and cholecystectomy who presents to the ED complaining of shortness of breath.  Patient reports that he initially tested positive for COVID-19 10 days ago after developing symptoms a few days prior to that.  He reports fevers, productive cough, generalized weakness, and shortness of breath that have increased over the past few days.  He became especially concerned when he woke up with pressure in his chest earlier this morning.  He has had some ongoing chest discomfort but overall the pain in his chest has improved.        Past Medical History:  Diagnosis Date   Anxiety    Cholecystitis    Migraines     Patient Active Problem List   Diagnosis Date Noted   Elevated LFTs 01/02/2020   Gastroesophageal reflux disease 01/02/2020   Chronic thoracic back pain 01/02/2020   Chronic neck pain 01/02/2020   Atypical chest pain 09/12/2019   Common bile duct leak    S/P cholecystectomy 08/27/2018   Acute cholecystitis 08/25/2018    Past Surgical History:  Procedure Laterality Date   CHOLECYSTECTOMY N/A 08/26/2018   Procedure: LAPAROSCOPIC CHOLECYSTECTOMY changed to open;  Surgeon: Sung Amabile, DO;  Location: [facility] ORS;  Service: General;  Laterality: N/A;   CHOLECYSTECTOMY  08/26/2018   Procedure: CHOLECYSTECTOMY;  Surgeon: Sung Amabile, DO;  Location: [facility] ORS;  Service: General;;   ERCP N/A 08/28/2018   Procedure: ENDOSCOPIC RETROGRADE CHOLANGIOPANCREATOGRAPHY (ERCP);  Surgeon: Midge Minium, MD;  Location: [facility] ENDOSCOPY;  Service: Endoscopy;  Laterality: N/A;    Prior to Admission  medications   Medication Sig Start Date End Date Taking? Authorizing Provider  cyclobenzaprine (FLEXERIL) 5 MG tablet Take 1-2 tablets (5-10 mg total) by mouth 3 (three) times daily as needed for muscle spasms. 01/02/20   Danelle Berry, PA-C  ibuprofen (ADVIL,MOTRIN) 800 MG tablet Take 1 tablet (800 mg total) by mouth every 8 (eight) hours as needed for mild pain or moderate pain. 08/30/18   Tonna Boehringer, Isami, DO  ondansetron (ZOFRAN ODT) 4 MG disintegrating tablet Take 1 tablet (4 mg total) by mouth every 8 (eight) hours as needed for nausea or vomiting. 04/04/20   Danelle Berry, PA-C  pantoprazole (PROTONIX) 40 MG tablet Take 1 tablet (40 mg total) by mouth daily. 12/18/19   Danelle Berry, PA-C  predniSONE (DELTASONE) 20 MG tablet Take 3 tablets (60 mg total) by mouth daily for 5 days. 04/07/20 04/12/20  Chesley Noon, MD    Allergies Codeine  Family History  Problem Relation Age of Onset   Depression Mother    Anxiety disorder Mother    Diabetes Father    Bleeding Disorder Father    Hyperlipidemia Father    Hypertension Father    Diabetes Sister    Depression Sister    Heart attack Maternal Grandfather 57   Breast cancer Paternal Grandmother    Depression Sister    Anxiety disorder Sister     Social History Social History   Tobacco Use   Smoking status: Never Smoker   Smokeless tobacco: Never Used  Building services engineer Use: Never used  Substance Use Topics   Alcohol  use: Not Currently    Comment: Drinks once every few months   Drug use: Never    Review of Systems  Constitutional: Positive for fever/chills Eyes: No visual changes. ENT: No sore throat. Cardiovascular: Positive for chest pain. Respiratory: Positive for shortness of breath.  Positive for cough. Gastrointestinal: No abdominal pain.  No nausea, no vomiting.  No diarrhea.  No constipation. Genitourinary: Negative for dysuria. Musculoskeletal: Negative for back pain. Skin: Negative for  rash. Neurological: Negative for headaches, focal weakness or numbness.  ____________________________________________   PHYSICAL EXAM:  VITAL SIGNS: ED Triage Vitals  Enc Vitals Group     BP 04/07/20 0503 104/70     Pulse Rate 04/07/20 0503 (!) 102     Resp 04/07/20 0503 (!) 22     Temp 04/07/20 0503 98.6 F (37 C)     Temp Source 04/07/20 0907 Oral     SpO2 04/07/20 0503 93 %     Weight 04/07/20 0503 211 lb (95.7 kg)     Height 04/07/20 0503 6' (1.829 m)     Head Circumference --      Peak Flow --      Pain Score 04/07/20 0503 8     Pain Loc --      Pain Edu? --      Excl. in GC? --     Constitutional: Alert and oriented. Eyes: Conjunctivae are normal. Head: Atraumatic. Nose: No congestion/rhinnorhea. Mouth/Throat: Mucous membranes are moist. Neck: Normal ROM Cardiovascular: Normal rate, regular rhythm. Grossly normal heart sounds. Respiratory: Mild tachypnea with normal respiratory effort.  No retractions. Lungs CTAB. Gastrointestinal: Soft and nontender. No distention. Genitourinary: deferred Musculoskeletal: No lower extremity tenderness nor edema. Neurologic:  Normal speech and language. No gross focal neurologic deficits are appreciated. Skin:  Skin is warm, dry and intact. No rash noted. Psychiatric: Mood and affect are normal. Speech and behavior are normal.  ____________________________________________   LABS (all labs ordered are listed, but only abnormal results are displayed)  Labs Reviewed  BASIC METABOLIC PANEL - Abnormal; Notable for the following components:      Result Value   Glucose, Bld 105 (*)    Calcium 8.6 (*)    All other components within normal limits  FIBRIN DERIVATIVES D-DIMER ([facility] ONLY) - Abnormal; Notable for the following components:   Fibrin derivatives D-dimer ([facility]) 974.99 (*)    All other components within normal limits  CBC  PROCALCITONIN  TROPONIN I (HIGH SENSITIVITY)  TROPONIN I (HIGH SENSITIVITY)    ____________________________________________  EKG  ED ECG REPORT I, Chesley Noon, the attending physician, personally viewed and interpreted this ECG.   Date: 04/07/2020  EKG Time: 5:09  Rate: 100  Rhythm: sinus tachycardia  Axis: Normal  Intervals:none  ST&T Change: none   PROCEDURES  Procedure(s) performed (including Critical Care):  Procedures   ____________________________________________   INITIAL IMPRESSION / ASSESSMENT AND PLAN / ED COURSE       34 year old male with past medical history of anxiety, migraines, and cholecystectomy who presents to the ED with increasing shortness of breath of acute onset chest pressure this morning after testing positive for COVID-19 10 days ago.  He is not in any respiratory distress on my evaluation, maintaining O2 sats at 95% on room air.  EKG shows no evidence of arrhythmia or ischemia and 2 sets of troponin are within normal limits, I doubt ACS.  Acute onset chest pain in the setting of COVID-19 is concerning for PE and given his elevated D-dimer,  we will further assess with CTA.  Otherwise, chest x-ray appears consistent with multifocal viral pneumonia.  Patient does not appear dehydrated at this time, we will treat symptomatically with albuterol inhaler.  CTA is negative for PE but does appear consistent with multifocal viral pneumonia and COVID-19.  He remained stable from a respiratory standpoint, no respiratory distress noted and he is maintaining O2 sats on room air.  He is appropriate for discharge home with albuterol and course of steroids.  He was counseled to return to the ED for new or worsening symptoms, patient agrees with plan.      ____________________________________________   FINAL CLINICAL IMPRESSION(S) / ED DIAGNOSES  Final diagnoses:  Pneumonia due to COVID-19 virus  Chest pain, unspecified type     ED Discharge Orders         Ordered    predniSONE (DELTASONE) 20 MG tablet  Daily        04/07/20  1424           Note:  This document was prepared using Dragon voice recognition software and may include unintentional dictation errors.   Chesley Noon, MD 04/07/20 1447

## 2021-01-13 IMAGING — CR DG CHEST 2V
2 series · 2 of 2 positions shown · non-contrast
Comparison: 01/07/2017

CLINICAL DATA: Chest and back pain, no known injury, initial
encounter

EXAM:
CHEST - 2 VIEW

[chest pa]
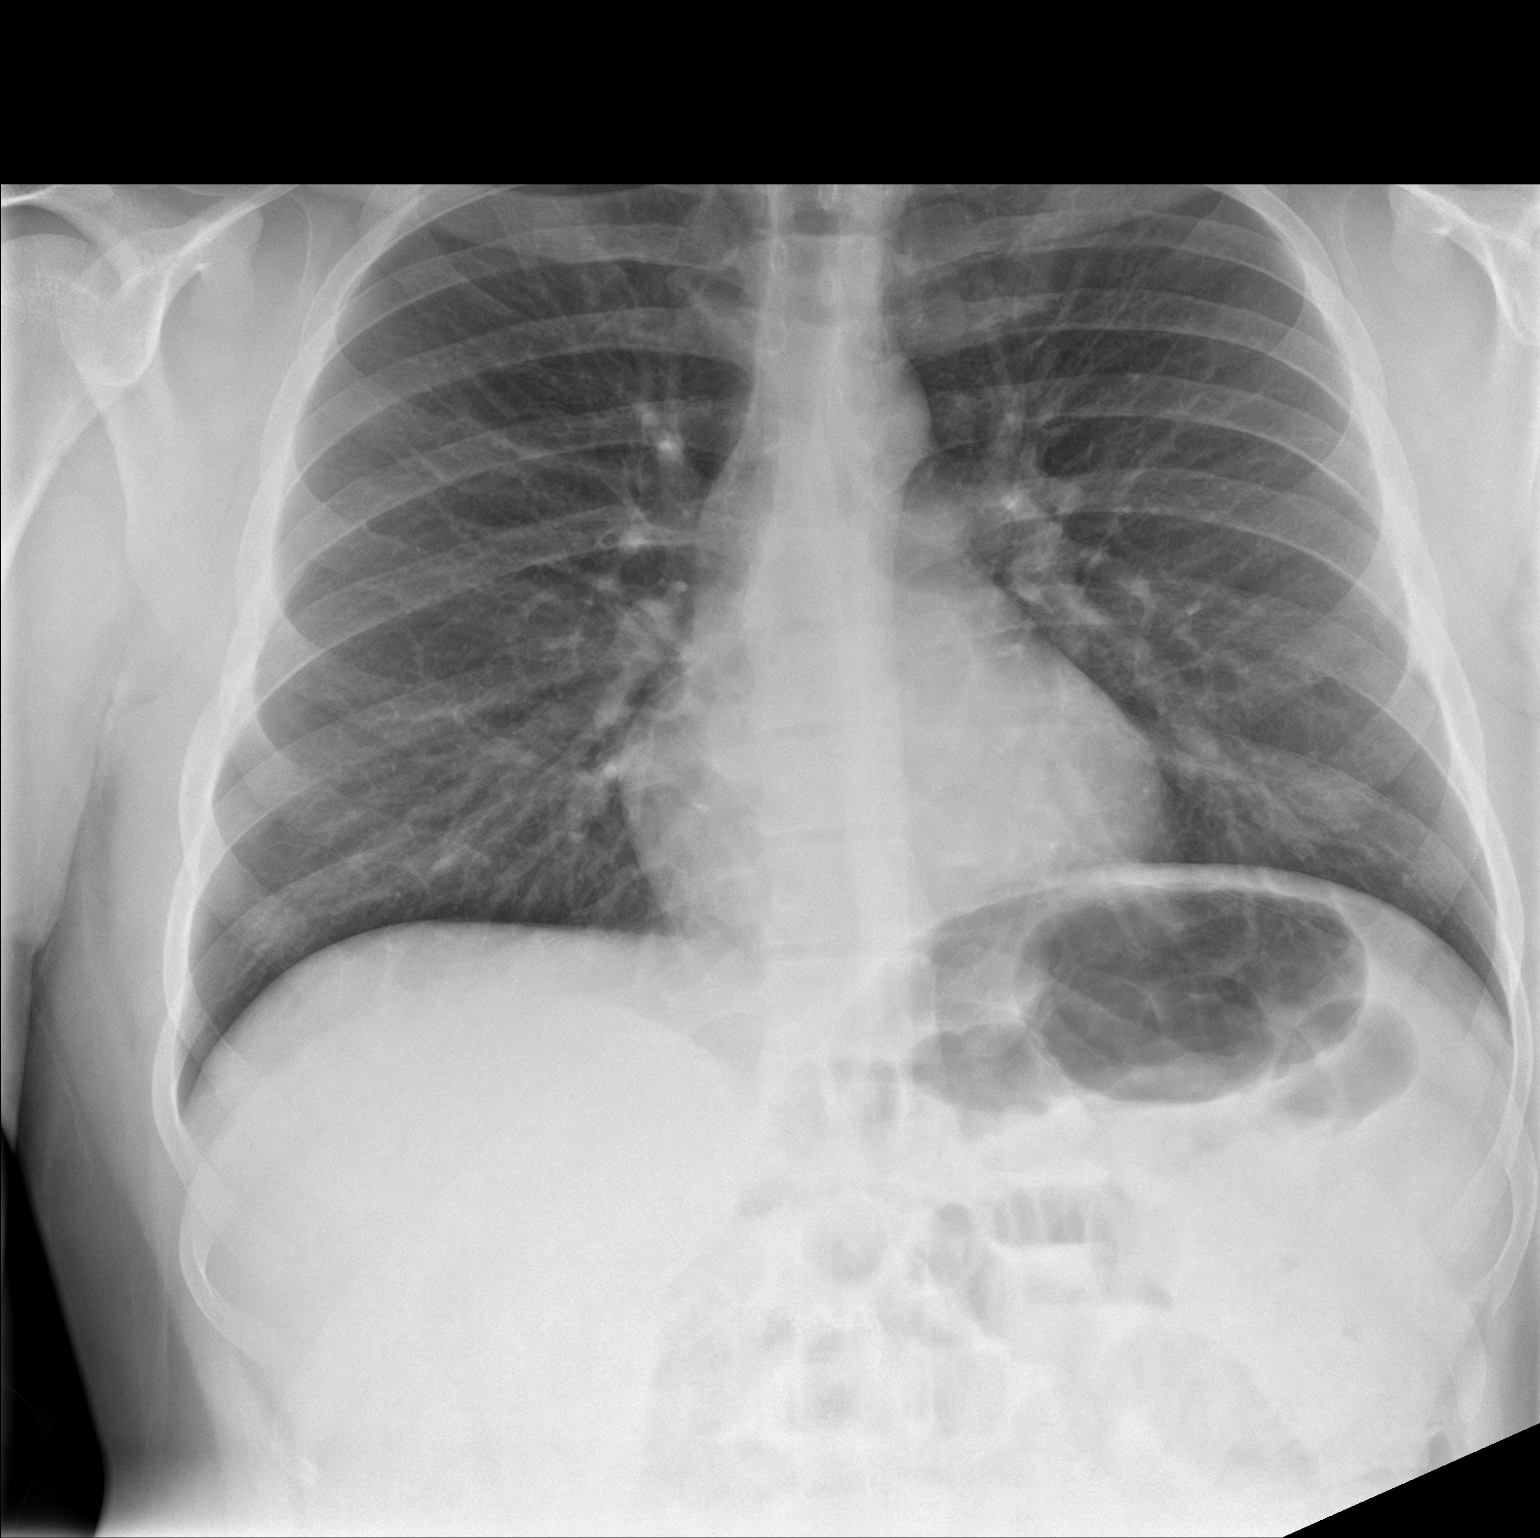

[chest lat]
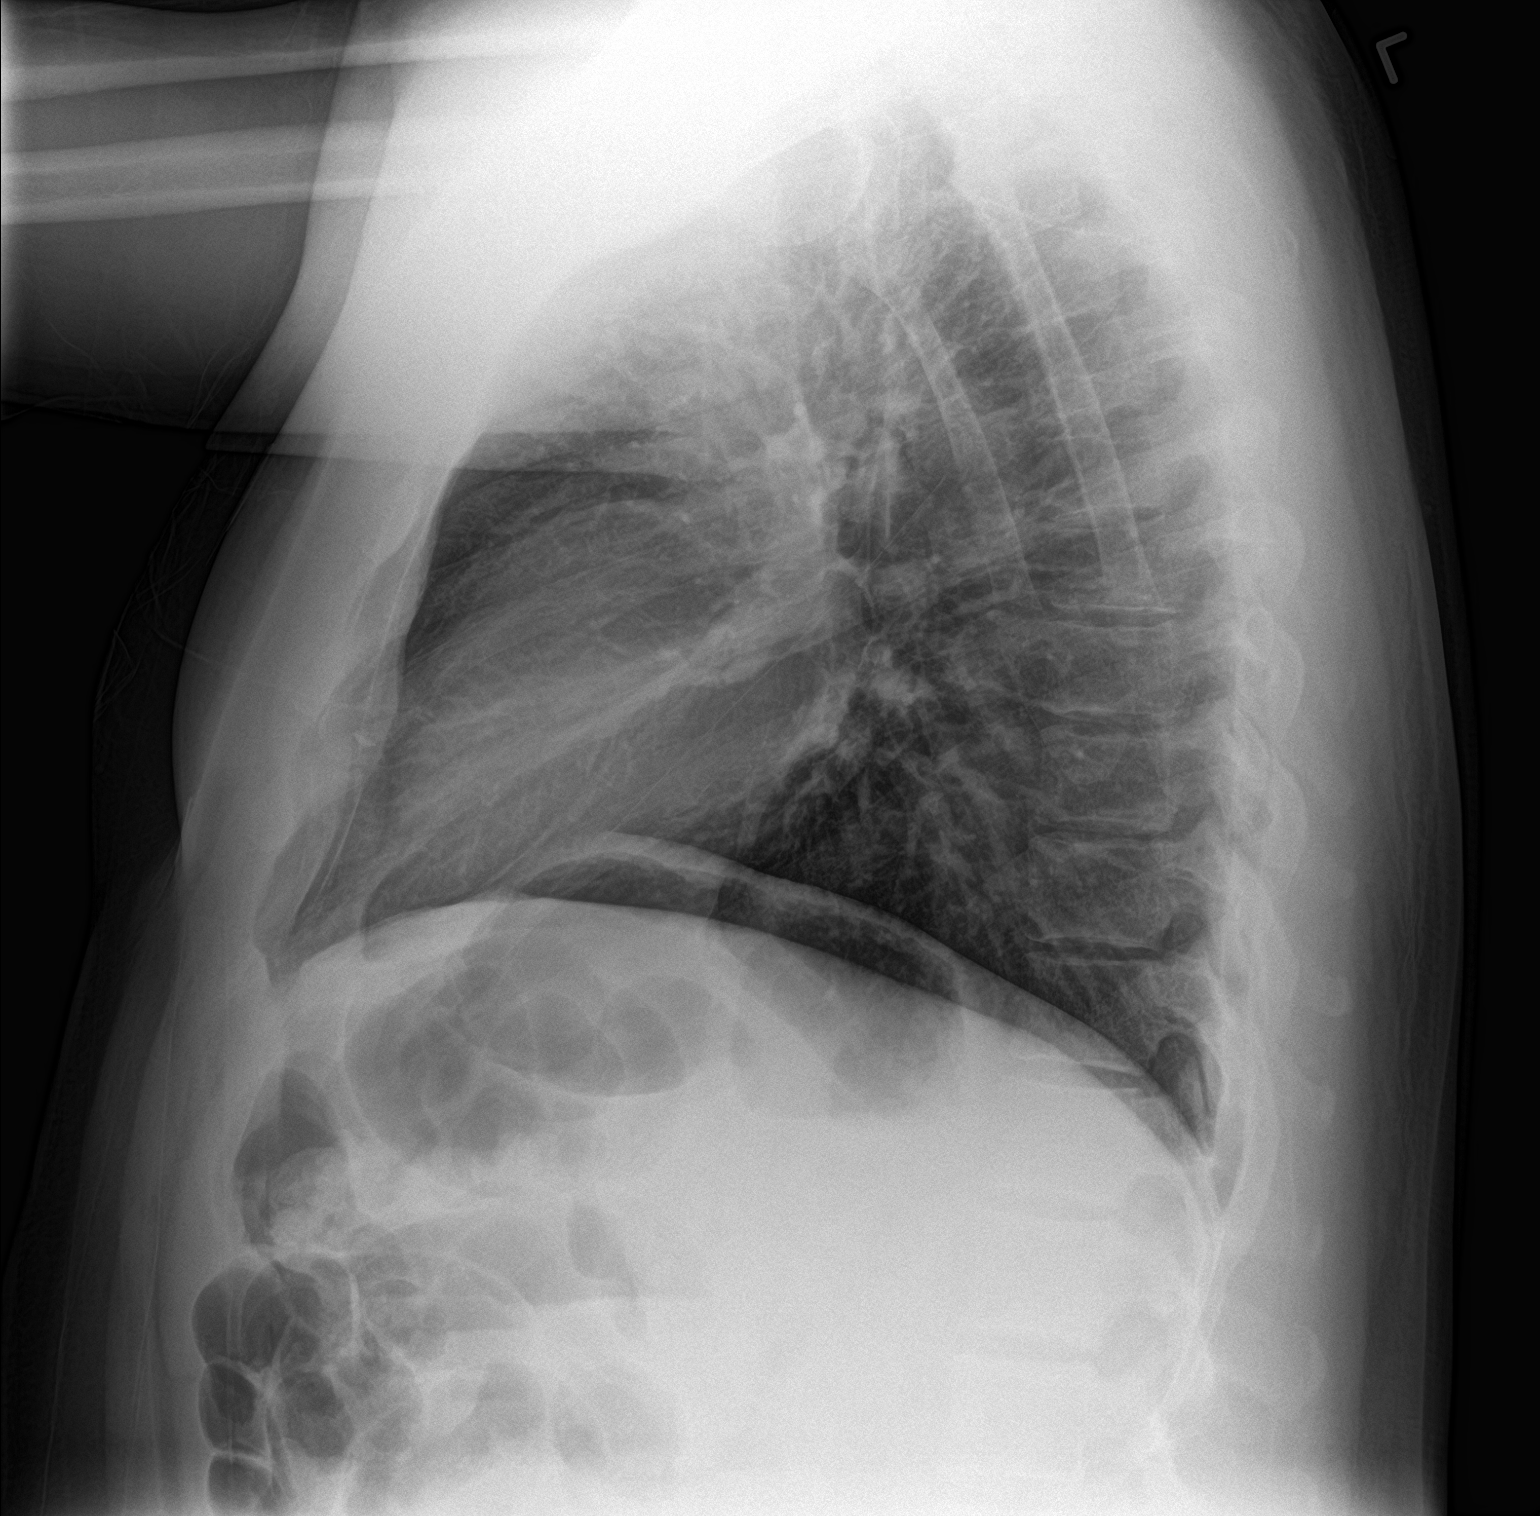

[2 of 2 positions shown; findings below may reference images not displayed]

FINDINGS: Cardiac shadow is within normal limits. The lungs are well aerated
bilaterally. No focal infiltrate or sizable effusion is seen. No
bony abnormality is noted.
IMPRESSION: No acute abnormality noted.

## 2021-06-23 ENCOUNTER — Other Ambulatory Visit: Payer: Self-pay

## 2021-06-23 ENCOUNTER — Encounter: Payer: Self-pay | Admitting: Family Medicine

## 2021-06-23 ENCOUNTER — Ambulatory Visit (INDEPENDENT_AMBULATORY_CARE_PROVIDER_SITE_OTHER): Payer: BC Managed Care – PPO | Admitting: Family Medicine

## 2021-06-23 VITALS — BP 118/60 | HR 94 | Temp 98.0°F | Resp 16 | Ht 71.0 in | Wt 209.9 lb

## 2021-06-23 DIAGNOSIS — M25532 Pain in left wrist: Secondary | ICD-10-CM

## 2021-06-23 MED ORDER — MELOXICAM 15 MG PO TABS: 15.0000 mg | ORAL_TABLET | Freq: Every day | ORAL | 0 refills | Status: DC | PRN

## 2021-06-23 NOTE — Patient Instructions (Signed)
Ice, rest, use a wrist brace or wrap, and take aleve or naproxen twice a day for at least a week If that upsets your stomach you can try once a day mobic prescription  And you may also use tylenol [phone] up to three times a day for pain   Wrist Pain, Adult There are many things that can cause wrist pain. Some common causes include: An injury to the wrist. Using the joint too much. A condition that causes too much pressure to be put on a nerve in the wrist (carpal tunnel syndrome). Wear and tear of the joints that happens as a person gets older (osteoarthritis). A condition that causes swelling and stiffness in the joints (arthritis). Sometimes, the cause of wrist pain is not known. Often, the pain goes away when you follow your doctor's instructions for easing pain at home. This may include resting your wrist, icing your wrist, or using a splint or an elastic wrap for a short time. It is important to tell your doctor if your wrist pain does not go away. Follow these instructions at home: If you have a splint or elastic wrap: Wear the splint or wrap as told by your doctor. Take it off only as told by your doctor. Ask if you can take it off for bathing. Loosen the splint or wrap if your fingers: Tingle. Become numb. Turn cold and blue. Check the skin around the splint or wrap every day. Tell your doctor about any concerns. Keep the splint or wrap clean. If the splint or wrap is not waterproof: Do not let it get wet. Cover it with a watertight covering when you take a bath or shower. Managing pain, stiffness, and swelling  If told, put ice on the painful area. To do this: If you have a removable splint or wrap, take it off as told by your doctor. Put ice in a plastic bag. Place a towel between your skin and the bag or between your splint or wrap and the bag. Leave the ice on for 20 minutes, 2-3 times a day. Move your fingers often. Raise (elevate) the injured area above the level of  your heart while you are sitting or lying down. Activity Rest your wrist as told by your doctor. Return to your normal activities as told by your doctor. Ask your doctor what activities are safe for you. Ask your doctor when it is safe to drive if you have a splint or wrap on your wrist. Do exercises as told by your doctor. General instructions Pay attention to any changes in your symptoms. Take over-the-counter and prescription medicines only as told by your doctor. Keep all follow-up visits as told by your doctor. This is important. Contact a doctor if: You have a sudden, sharp pain in the wrist, hand, or arm that is different or new. The swelling or bruising on your wrist or hand gets worse. Your skin: Becomes red. Gets a rash. Has open sores. Your pain does not get better. Your pain gets worse. You have a fever or chills. Get help right away if: You lose feeling in your fingers or hand. Your fingers turn white, very red, or cold and blue. You cannot move your fingers. Summary There are many things that can cause wrist pain. It is important to tell your doctor if your wrist pain does not go away. You may need to wear a splint or a wrap for a short period of time. Return to your normal activities as told  by your doctor. Ask your doctor what activities are safe for you. This information is not intended to replace advice given to you by your health care provider. Make sure you discuss any questions you have with your health care provider. Document Revised: 05/17/2019 Document Reviewed: 05/17/2019 Elsevier Patient Education  2022 [employer]   Dupuytren's Contracture Dupuytren's contracture is a condition in which tissue under the skin of the palm becomes thick. This causes one or more of the fingers to curl inward (contract) toward the palm. After a while, the fingers may not be able to straighten out. This condition affects some or all of the fingers and the palm of the hand.  This condition may affect one or both hands. Dupuytren's contracture is a long-term (chronic) condition that develops (progresses) slowly over time. There is no cure, but symptoms can be managed and progression can be slowed with treatment. This condition is usually not dangerous or painful, but it can interfere with everyday tasks. What are the causes? This condition is caused by tissue (fascia) in the palm that gets thicker and tighter. When the fascia thickens, it pulls on the cords of tissue (tendons) that control finger movement. This causes the fingers to contract. The cause of fascia thickening is not known. However, the condition is often passed along from parent to child (inherited). What increases the risk? The following factors may make you more likely to develop this condition: Being 37 years of age or older. Being male. Having a family history of this condition. Using tobacco products, including cigarettes, chewing tobacco, and e-cigarettes. Drinking alcohol excessively. Having diabetes. Having a seizure disorder. What are the signs or symptoms? Early symptoms of this condition may include: Thick, puckered skin on the hand. One or more lumps (nodules) on the palm. Nodules may be tender when they first appear, but they are generally painless. Later symptoms of this condition may include: Thick cords of tissue in the palm. Fingers curled up toward the palm. Inability to straighten the fingers into their normal position. Though this condition is usually painless, you may have discomfort when holding or grabbing objects. How is this diagnosed? This condition is diagnosed with a physical exam, which may include: Looking at your hands and feeling your palms. This is to check for thickened fascia and nodules. Measuring finger motion. Doing the [place] tabletop test. You may be asked to try to put your hand on a surface, with your palm down and your fingers straight out. How is this  treated? There is no cure for this condition, but treatment can relieve discomfort and make symptoms more manageable. Treatment options may include: Physical therapy. This can strengthen your hand and increase flexibility. Occupational therapy. This can help you with everyday tasks that may be more difficult because of your condition. Shots (injections). Substances may be injected into your hand, such as: Medicines that help to decrease swelling (corticosteroids). Proteins (collagenase) to weaken thick tissue. After a collagenase injection, your health care provider may stretch your fingers. Needle aponeurotomy. A needle is pushed through the skin and into the fascia. Moving the needle against the fascia can weaken or break up the thick tissue. Surgery. This may be needed if your condition causes discomfort or interferes with everyday activities. Physical therapy is usually needed after surgery. No treatment is guaranteed to cure this condition. Recurrence of symptoms is common. Follow these instructions at home: Hand care Take these actions to help protect your hand from possible injury: Use tools that have  padded grips. Wear protective gloves while you work with your hands. Avoid repetitive hand movements. General instructions Take over-the-counter and prescription medicines only as told by your health care provider. Manage any other conditions that you have, such as diabetes. If physical therapy was prescribed, do exercises as told by your health care provider. Do not use any products that contain nicotine or tobacco, such as cigarettes, e-cigarettes, and chewing tobacco. If you need help quitting, ask your health care provider. If you drink alcohol: Limit how much you have to: 0-1 drink a day for women who are not pregnant. 0-2 drinks a day for men. Be aware of how much alcohol is in your drink. In the [place] one drink equals one 12 oz bottle of beer (355 mL), one 5 oz glass of wine (148  mL), or one 1 oz glass of hard liquor (44 mL). Keep all follow-up visits as told by your health care provider. This is important. Contact a health care provider if: You develop new symptoms, or your symptoms get worse. You have pain that gets worse or does not get better with medicine. You have difficulty or discomfort with everyday tasks. You develop numbness or tingling. Get help right away if: You have severe pain. Your fingers change color or become unusually cold. Summary Dupuytren's contracture is a condition in which tissue under the skin of the palm becomes thick. This condition is caused by tissue (fascia) that thickens. When it thickens, it pulls on the cords of tissue (tendons) that control finger movement and makes the fingers contract. You are more likely to develop this condition if you are a man, are over 21 years of age, have a family history of the condition, and drink a lot of alcohol. This condition can be treated with physical and occupational therapy, injections, and surgery. Follow instructions about how to care for your hand. Get help right away if you have severe pain or your fingers change color or become cold. This information is not intended to replace advice given to you by your health care provider. Make sure you discuss any questions you have with your health care provider. Document Revised: 10/07/2020 Document Reviewed: 10/07/2020 Elsevier Patient Education  2022 [employer].

## 2021-06-23 NOTE — Progress Notes (Signed)
Patient ID: William Dennis, male    DOB: [birth year], 35 y.o.   MRN: [id number]  PCP: Danelle Berry, PA-C  Chief Complaint  Patient presents with   Wrist Pain    Left hand x1 month, pain is now starting to spread around the hand.    Subjective:   William Dennis is a 35 y.o. male, presents to clinic with CC of the following:  Pt is right hand dominant, presents with gradual onset of left thumb and wrist pain.  No hx of trauma/strain.  First occurred with left thumb feeling stuck in the am upon waking and he would have to "pop it" back in normal position - with some associated pain.  Pain has gradually increased, most recently after lifting at the gym or when making a fist.  No numbness, redness, swelling.  Pain located to dorsal left wrist, in hand between 1 and 2nd metacarpal.  Normal ROM today.  No bumps/nodules in hand.  Wrist Pain  Pain location: left thumb and hand and spreading to left wrist. This is a new problem. The current episode started more than 1 month ago. There has been no history of extremity trauma. The problem occurs intermittently. The problem has been gradually worsening. The quality of the pain is described as aching. The pain is mild. Pertinent negatives include no fever, inability to bear weight, itching, joint locking, joint swelling, limited range of motion, numbness, stiffness or tingling. The symptoms are aggravated by activity. He has tried nothing for the symptoms. Family history does not include gout or rheumatoid arthritis. There is no history of diabetes, gout, osteoarthritis or rheumatoid arthritis.     Patient Active Problem List   Diagnosis Date Noted   Elevated LFTs 01/02/2020   Gastroesophageal reflux disease 01/02/2020   Chronic thoracic back pain 01/02/2020   Chronic neck pain 01/02/2020   Atypical chest pain 09/12/2019   Common bile duct leak    S/P cholecystectomy 08/27/2018   Acute cholecystitis 08/25/2018      Current Outpatient  Medications:    cyclobenzaprine (FLEXERIL) 5 MG tablet, Take 1-2 tablets (5-10 mg total) by mouth 3 (three) times daily as needed for muscle spasms., Disp: 30 tablet, Rfl: 0   ibuprofen (ADVIL,MOTRIN) 800 MG tablet, Take 1 tablet (800 mg total) by mouth every 8 (eight) hours as needed for mild pain or moderate pain., Disp: 30 tablet, Rfl: 0   ondansetron (ZOFRAN ODT) 4 MG disintegrating tablet, Take 1 tablet (4 mg total) by mouth every 8 (eight) hours as needed for nausea or vomiting., Disp: 20 tablet, Rfl: 1   pantoprazole (PROTONIX) 40 MG tablet, Take 1 tablet (40 mg total) by mouth daily., Disp: 90 tablet, Rfl: 3   Allergies  Allergen Reactions   Codeine Anxiety     Social History   Tobacco Use   Smoking status: Never   Smokeless tobacco: Never  Vaping Use   Vaping Use: Never used  Substance Use Topics   Alcohol use: Not Currently    Comment: Drinks once every few months   Drug use: Never      Chart Review Today: I personally reviewed active problem list, medication list, allergies, family history, social history, health maintenance, notes from last encounter, lab results, imaging with the patient/caregiver today.   Review of Systems  Constitutional: Negative.  Negative for fever.  HENT: Negative.    Eyes: Negative.   Respiratory: Negative.    Cardiovascular: Negative.   Gastrointestinal: Negative.   Endocrine: Negative.  Genitourinary: Negative.   Musculoskeletal:  Positive for arthralgias. Negative for gout, joint swelling and stiffness.  Skin: Negative.  Negative for color change and itching.  Allergic/Immunologic: Negative.   Neurological: Negative.  Negative for tingling, tremors, weakness and numbness.  Hematological: Negative.   Psychiatric/Behavioral: Negative.    All other systems reviewed and are negative.     Objective:   Vitals:   06/23/21 1047  BP: 118/60  Pulse: 94  Resp: 16  Temp: 98 F (36.7 C)  TempSrc: Oral  SpO2: 97%  Weight: 209 lb  14.4 oz (95.2 kg)  Height: 5\' 11"  (1.803 m)    Body mass index is 29.28 kg/m.  Physical Exam Vitals and nursing note reviewed.  Constitutional:      General: He is not in acute distress.    Appearance: Normal appearance. He is normal weight. He is not ill-appearing, toxic-appearing or diaphoretic.  HENT:     Head: Normocephalic and atraumatic.  Musculoskeletal:     Right wrist: Normal. No swelling, deformity, effusion, lacerations, tenderness, bony tenderness, snuff box tenderness or crepitus. Normal range of motion. Normal pulse.     Left wrist: No swelling, deformity, effusion, tenderness, bony tenderness, snuff box tenderness or crepitus. Normal range of motion. Normal pulse.     Right hand: No swelling or deformity. Normal range of motion. Normal strength.     Left hand: No swelling or deformity. Normal range of motion. Normal strength.     Comments: Neg tinel's sign b/l Symmetrical grip strength 5/5  Neurological:     Mental Status: He is alert. Mental status is at baseline.     Gait: Gait normal.  Psychiatric:        Mood and Affect: Mood normal.     Results for orders placed or performed during the hospital encounter of 04/07/20  Basic metabolic panel  Result Value Ref Range   Sodium 137 135 - 145 mmol/L   Potassium 3.8 3.5 - 5.1 mmol/L   Chloride 99 98 - 111 mmol/L   CO2 25 22 - 32 mmol/L   Glucose, Bld 105 (H) 70 - 99 mg/dL   BUN 11 6 - 20 mg/dL   Creatinine, Ser 04/09/20 0.61 - 1.24 mg/dL   Calcium 8.6 (L) 8.9 - 10.3 mg/dL   GFR calc non Af Amer >60 >60 mL/min   GFR calc Af Amer >60 >60 mL/min   Anion gap 13 5 - 15  CBC  Result Value Ref Range   WBC 7.5 4.0 - 10.5 K/uL   RBC 5.49 4.22 - 5.81 MIL/uL   Hemoglobin 16.3 13.0 - 17.0 g/dL   HCT 2.70 35.0 - 09.3 %   MCV 84.2 80.0 - 100.0 fL   MCH 29.7 26.0 - 34.0 pg   MCHC 35.3 30.0 - 36.0 g/dL   RDW 81.8 29.9 - 37.1 %   Platelets 171 150 - 400 K/uL   nRBC 0.0 0.0 - 0.2 %  Procalcitonin - Baseline  Result Value  Ref Range   Procalcitonin 0.12 ng/mL  Fibrin derivatives D-Dimer ([facility] only)  Result Value Ref Range   Fibrin derivatives D-dimer ([facility]) 974.99 (H) 0.00 - 499.00 ng/mL (FEU)  Troponin I (High Sensitivity)  Result Value Ref Range   Troponin I (High Sensitivity) 3 <18 ng/L  Troponin I (High Sensitivity)  Result Value Ref Range   Troponin I (High Sensitivity) 4 <18 ng/L       Assessment & Plan:   1. Acute pain of left wrist Mild  pain and gradual sx onset w/o trauma/injury Encouraged conservative measures, avoid repetitive activity, ice, use NSAIDs (aleve or naproxen with PPI if needed and if stomach upset can try mobic) and tylenol.  Encouraged wrist brace when lifting weights if he doesn't want to take a break from that.  I cannot reproduce the "stuck" feeling of his thumb and do not appreciate a cyst or anything today.  No swelling, decreased ROM, redness, numbness, weakness - will proceed with conservative measures and told the pt to contact us in 2 weeks if not better and we can put in a referral to ortho/hand specialists (w/o need for f/up visit)  - meloxicam (MOBIC) 15 MG tablet; Take 1 tablet (15 mg total) by mouth daily as needed for pain.  Dispense: 30 tablet; Refill: 0      Danelle Berry, PA-C 06/23/21 11:10 AM

## 2021-08-03 ENCOUNTER — Ambulatory Visit: Payer: Self-pay

## 2021-08-03 DIAGNOSIS — M25532 Pain in left wrist: Secondary | ICD-10-CM

## 2021-08-03 NOTE — Telephone Encounter (Signed)
°  Chief Complaint: Left wrist pain has worsened since OV with Leisa on 06/23/21. Per chart note the patient was told to call back for referral if no improvement. Has used a wrist brace and anti-inflammatory provided.  Symptoms: left wrist pain Frequency:  Pertinent Negatives: Patient denies fever/heat, redness. Disposition: [] ED /[] Urgent Care (no appt availability in office) / [] Appointment(In office/virtual)/ []  [facility]/ [] Home Care/ [] Refused Recommended Disposition /[] [facility]/ []  Follow-up with PCP Additional Notes: Requesting ortho referral.    Reason for Disposition  Hand or wrist pain is a chronic symptom (recurrent or ongoing AND present > 4 weeks)  Answer Assessment - Initial Assessment Questions 1. ONSET: "When did the pain start?"     Prior to December's visit. 2. LOCATION: "Where is the pain located?"     Thumb area feels locked in the morning is the worst 3. PAIN: "How bad is the pain?" (Scale 1-10; or mild, moderate, severe)   - MILD (1-3): doesn't interfere with normal activities   - MODERATE (4-7): interferes with normal activities (e.g., work or school) or awakens from sleep   - SEVERE (8-10): excruciating pain, unable to use hand at all     From 6-7 when 1st wakes up and 3-4 thru-out the day.  4. WORK OR EXERCISE: "Has there been any recent work or exercise that involved this part (i.e., hand or wrist) of the body?"     5. CAUSE: "What do you think is causing the pain?"     unsure 6. AGGRAVATING FACTORS: "What makes the pain worse?" (e.g., using computer)     Nothing in particular 7. OTHER SYMPTOMS: "Do you have any other symptoms?" (e.g., neck pain, swelling, rash, numbness, fever)     no 8. PREGNANCY: "Is there any chance you are pregnant?" "When was your last menstrual period?"     na  Protocols used: Hand and Wrist Pain-A-AH

## 2021-08-03 NOTE — Telephone Encounter (Signed)
Pt called saying He is still having wrist pain.  He said the doctor he seen told him to call the office if he was still having pain and they would refer him to a specialist.  He has called saying he sither needs to see her or be referred.   Left message to call back about pain symptoms.

## 2021-08-04 NOTE — Telephone Encounter (Signed)
Ambulatory referral for orthopedics sent per review of 06/23/2021 encounter plan.

## 2021-08-04 NOTE — Addendum Note (Signed)
Addended by: Jacquelin Hawking on: 08/04/2021 09:10 AM   Modules accepted: Orders

## 2021-08-04 NOTE — Telephone Encounter (Signed)
Pt was notified and gave [facility] number to pt so he can call and get himself scheduled.

## 2022-02-11 DIAGNOSIS — M25561 Pain in right knee: Secondary | ICD-10-CM | POA: Diagnosis not present

## 2022-02-11 DIAGNOSIS — M25562 Pain in left knee: Secondary | ICD-10-CM | POA: Diagnosis not present

## 2022-02-11 DIAGNOSIS — M2391 Unspecified internal derangement of right knee: Secondary | ICD-10-CM | POA: Diagnosis not present

## 2022-08-30 IMAGING — CR DG CHEST 2V
1 series · 2 of 2 positions shown · non-contrast
Comparison: Chest x-ray 09/27/2018.

CLINICAL DATA: Shortness of breath. Flu like symptoms. COVID
positive.

EXAM:
CHEST - 2 VIEW

[Series 1: dg chest 2 view · 0.14mm/px · 2 of 2 slices shown]
[im 1/2]
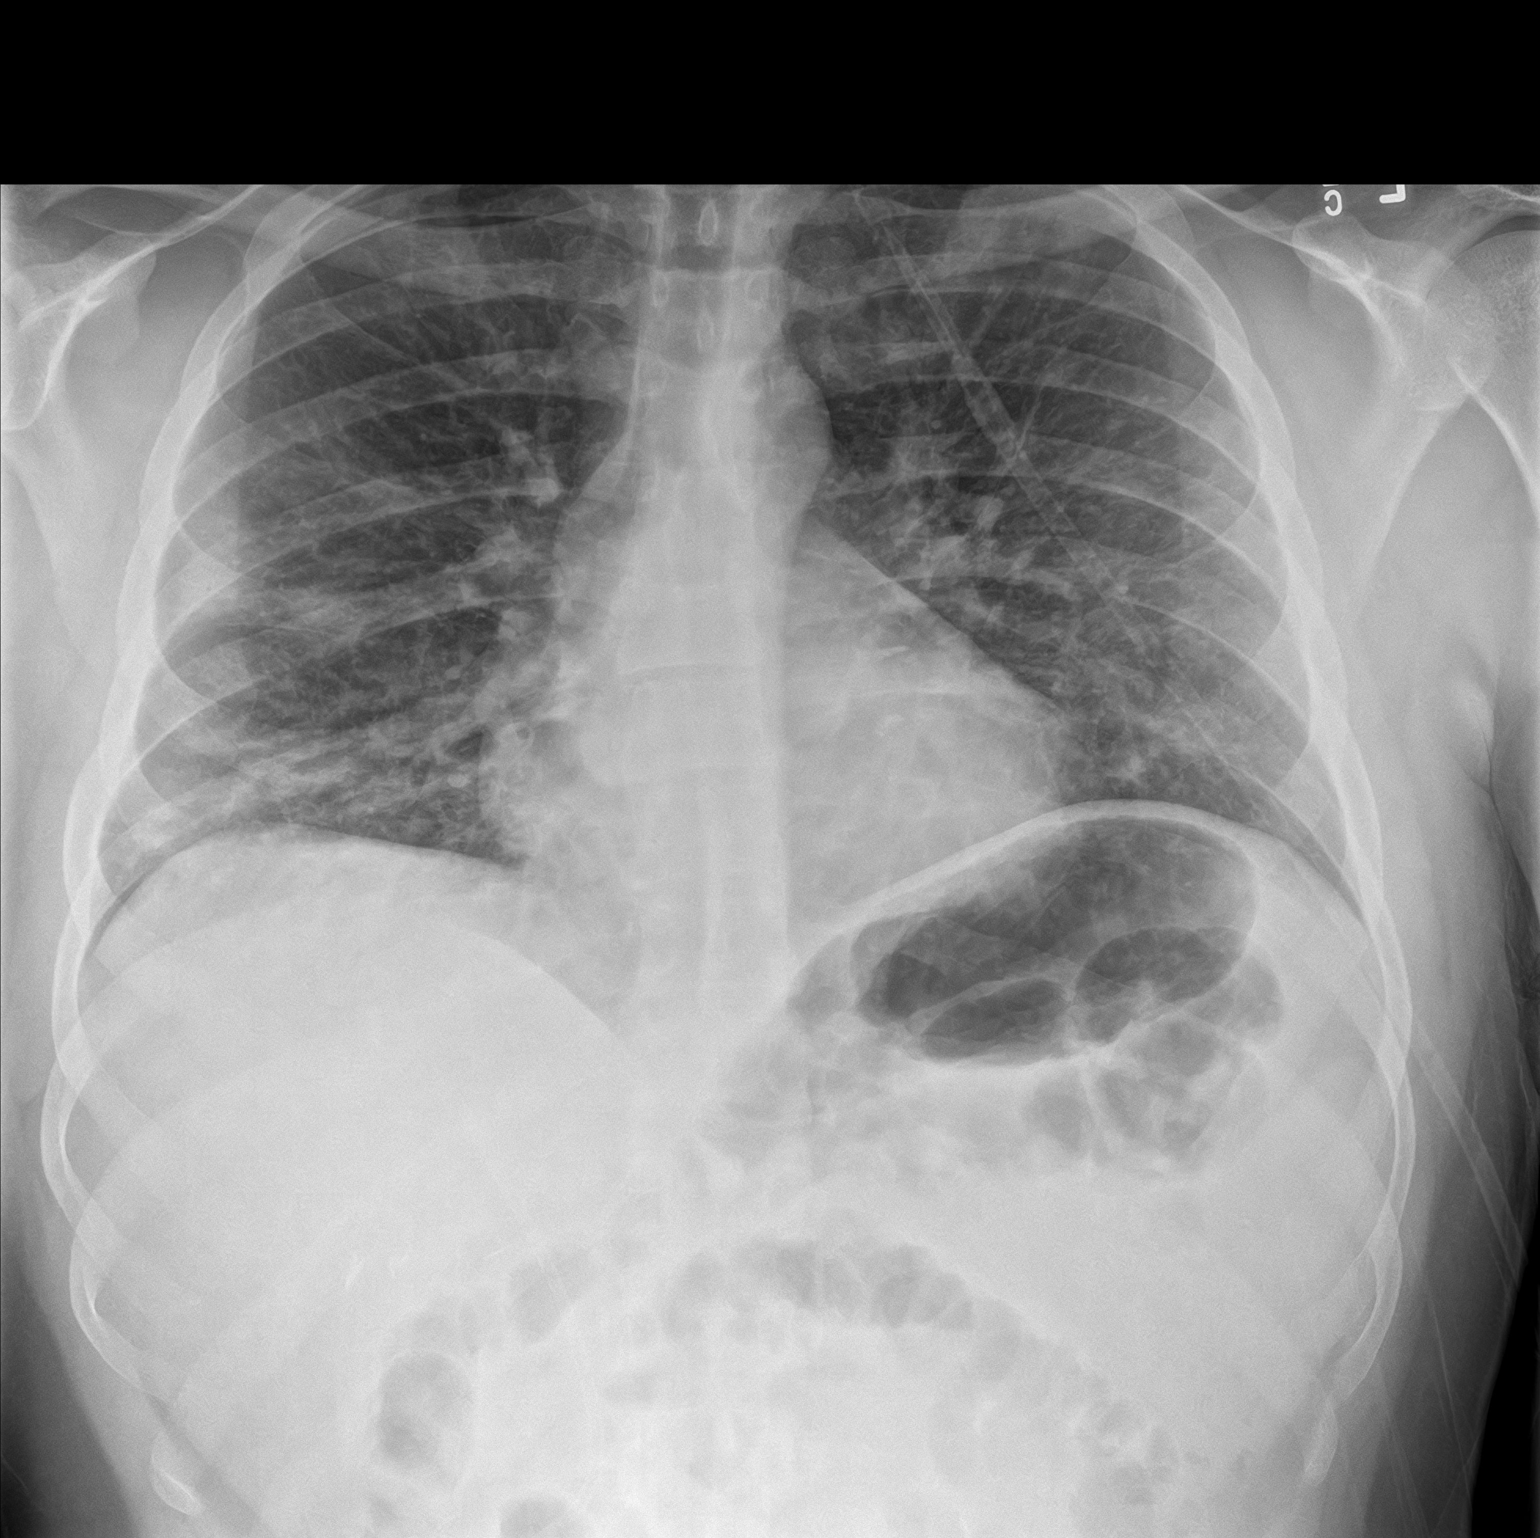
[im 2/2]
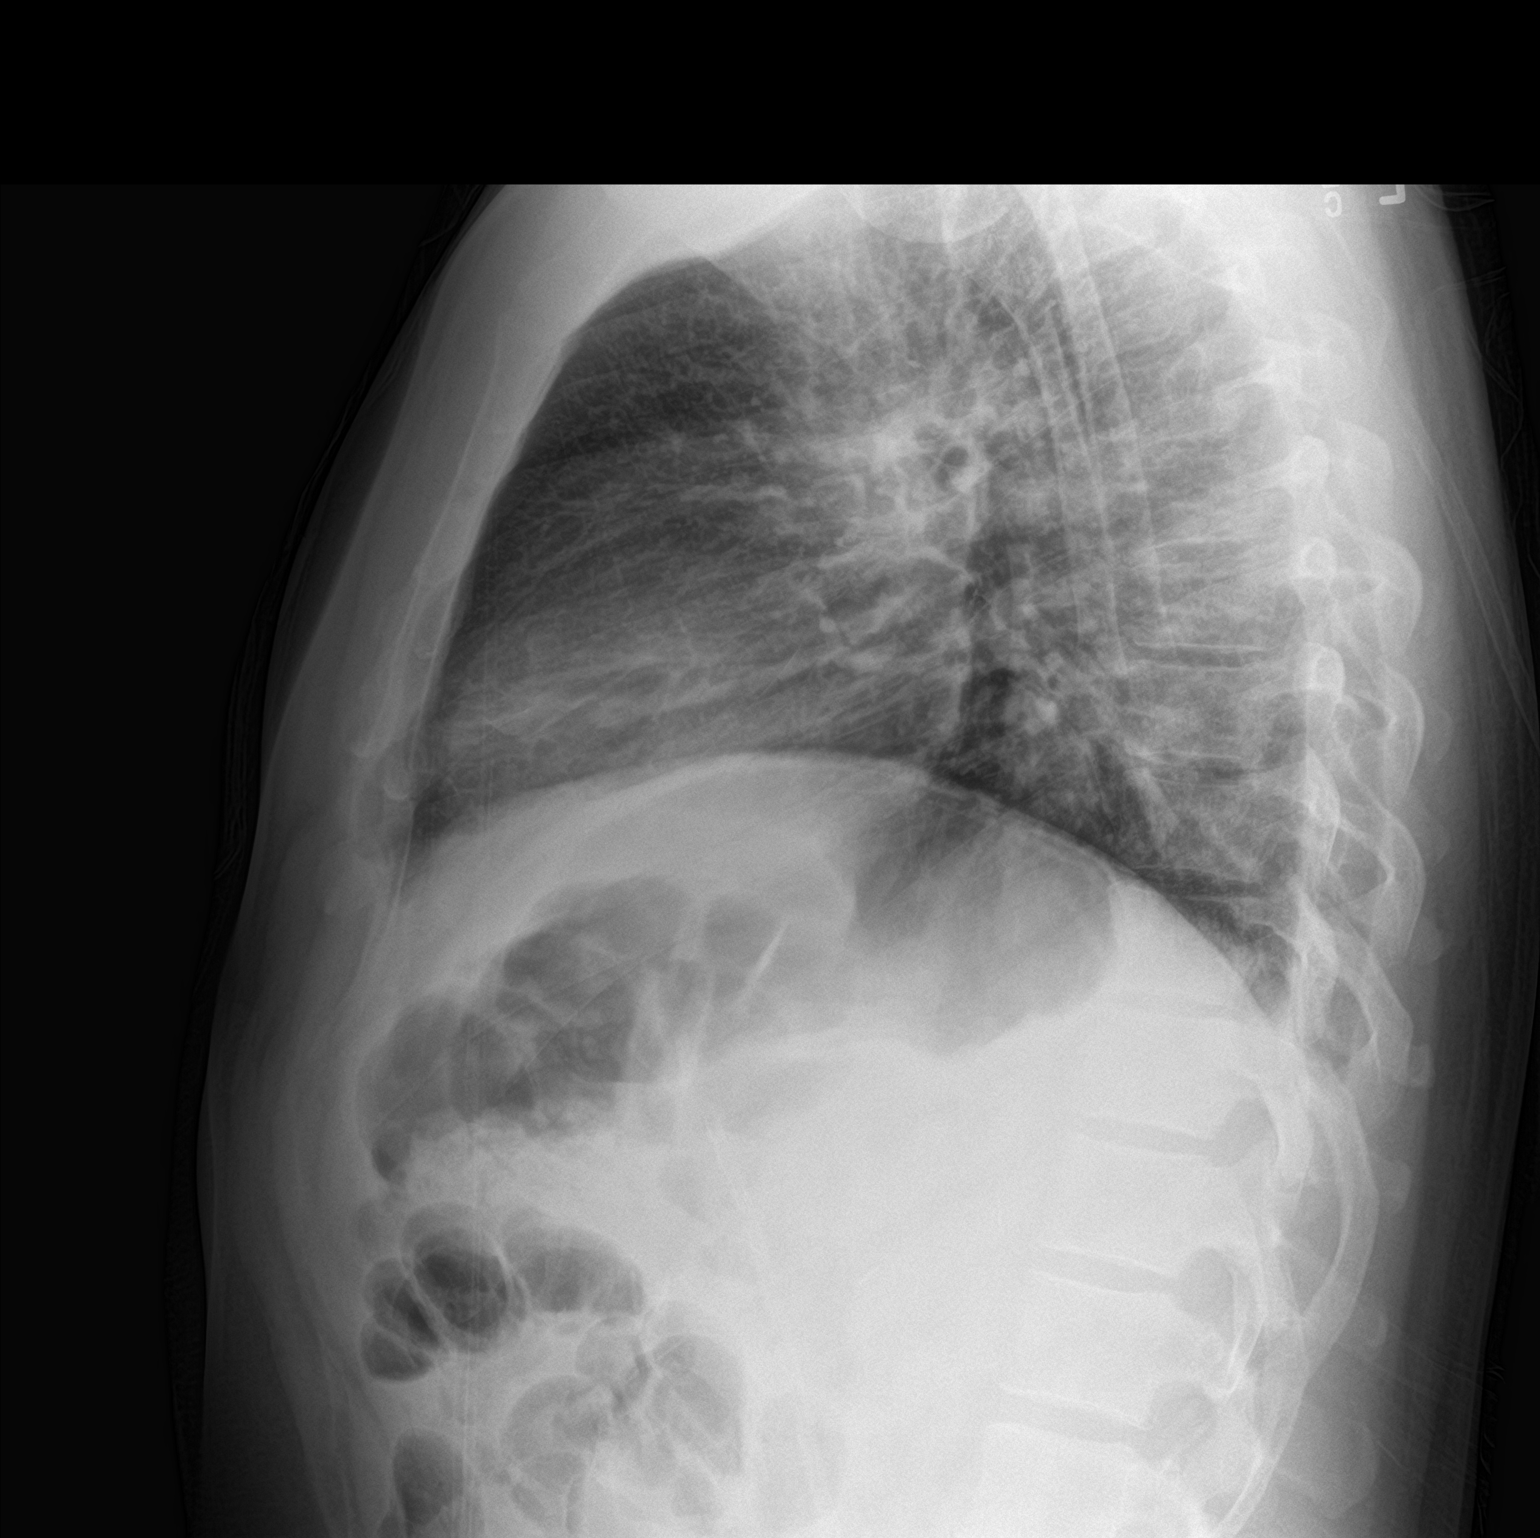

[2 of 2 positions shown; findings below may reference images not displayed]

FINDINGS: Mediastinum hilar structures normal. Heart size normal. Diffuse
bilateral pulmonary interstitial infiltrates noted. No pleural
effusion or pneumothorax.
IMPRESSION: Diffuse bilateral pulmonary interstitial infiltrates consistent with
pneumonia.

## 2022-09-14 DIAGNOSIS — H6692 Otitis media, unspecified, left ear: Secondary | ICD-10-CM | POA: Diagnosis not present

## 2023-02-07 ENCOUNTER — Ambulatory Visit: Payer: BC Managed Care – PPO | Admitting: Family Medicine

## 2023-02-07 VITALS — BP 110/68 | HR 87 | Temp 98.0°F | Resp 16 | Ht 71.0 in | Wt 205.0 lb

## 2023-02-07 DIAGNOSIS — R748 Abnormal levels of other serum enzymes: Secondary | ICD-10-CM | POA: Diagnosis not present

## 2023-02-07 DIAGNOSIS — R17 Unspecified jaundice: Secondary | ICD-10-CM

## 2023-02-07 DIAGNOSIS — R7989 Other specified abnormal findings of blood chemistry: Secondary | ICD-10-CM | POA: Diagnosis not present

## 2023-02-07 NOTE — Progress Notes (Unsigned)
Patient ID: William Dennis, male    DOB: August 29, 1985, 37 y.o.   MRN: [id number]  PCP: Danelle Berry, PA-C  Chief Complaint  Patient presents with   Results    Discuss recent labs done through work pt has them on phone.    Subjective:   William Dennis is a 37 y.o. 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 a few weeks ago He has not had any abd pain, recent viral infections, N, V, D, rash, fever, chills, sweats, jaundice, weight loss, decreased appetite He does not drink alcohol at all He rarely takes NSAIDs -no recent tylenol He says he was doing a "cleanse" a few weeks ago  About 4 years ago he had cholecystectomy with bile leak but after the post op period he did not have any continued issues Past LFTs have had mild elevation only He has lost 30 lbs and changed diet/lifestyle over the past couple years   Patient Active Problem List   Diagnosis Date Noted   Elevated LFTs 01/02/2020   Gastroesophageal reflux disease 01/02/2020   Chronic thoracic back pain 01/02/2020   Chronic neck pain 01/02/2020   Atypical chest pain 09/12/2019   Common bile duct leak    S/P cholecystectomy 08/27/2018   Acute cholecystitis 08/25/2018      Current Outpatient Medications:    cyclobenzaprine (FLEXERIL) 5 MG tablet, Take 1-2 tablets (5-10 mg total) by mouth 3 (three) times daily as needed for muscle spasms. (Patient not taking: Reported on 02/07/2023), Disp: 30 tablet, Rfl: 0   meloxicam (MOBIC) 15 MG tablet, Take 1 tablet (15 mg total) by mouth daily as needed for pain. (Patient not taking: Reported on 02/07/2023), Disp: 30 tablet, Rfl: 0   ondansetron (ZOFRAN ODT) 4 MG disintegrating tablet, Take 1 tablet (4 mg total) by mouth every 8 (eight) hours as needed for nausea or vomiting. (Patient not taking:  Reported on 02/07/2023), Disp: 20 tablet, Rfl: 1   pantoprazole (PROTONIX) 40 MG tablet, Take 1 tablet (40 mg total) by mouth daily. (Patient not taking: Reported on 02/07/2023), Disp: 90 tablet, Rfl: 3   Allergies  Allergen Reactions   Codeine Anxiety     Social History   Tobacco Use   Smoking status: Never   Smokeless tobacco: Never  Vaping Use   Vaping status: Never Used  Substance Use Topics   Alcohol use: Not Currently    Comment: Drinks once every few months   Drug use: Never      Chart Review Today: I personally reviewed active problem list, medication list, allergies, family history, social history, health maintenance, notes from last encounter, lab results, imaging with the patient/caregiver today.   Review of Systems  Constitutional: Negative.   HENT: Negative.    Eyes: Negative.   Respiratory: Negative.    Cardiovascular: Negative.   Gastrointestinal: Negative.   Endocrine: Negative.   Genitourinary: Negative.   Musculoskeletal: Negative.   Skin: Negative.   Allergic/Immunologic: Negative.   Neurological: Negative.   Hematological: Negative.   Psychiatric/Behavioral: Negative.    All other systems reviewed and are negative.      Objective:   Vitals:   02/07/23 1340  BP: 110/68  Pulse: 87  Resp: 16  Temp: 98 F (36.7 C)  TempSrc: Oral  SpO2: 97%  Weight:  205 lb (93 kg)  Height: 5\' 11"  (1.803 m)    Body mass index is 28.59 kg/m.  Physical Exam Vitals and nursing note reviewed.  Constitutional:      General: He is not in acute distress.    Appearance: Normal appearance. He is well-developed and well-groomed. He is not ill-appearing, toxic-appearing or diaphoretic.     Interventions: Face mask in place.  HENT:     Head: Normocephalic and atraumatic.     Jaw: No trismus.     Right Ear: External ear normal.     Left Ear: External ear normal.     Mouth/Throat:     Mouth: Mucous membranes are moist.     Pharynx: Oropharynx is clear.   Eyes:     General: Lids are normal. No scleral icterus.       Right eye: No discharge.        Left eye: No discharge.     Conjunctiva/sclera: Conjunctivae normal.  Neck:     Trachea: Trachea and phonation normal. No tracheal deviation.  Cardiovascular:     Rate and Rhythm: Normal rate and regular rhythm.     Pulses: Normal pulses.          Radial pulses are 2+ on the right side and 2+ on the left side.       Posterior tibial pulses are 2+ on the right side and 2+ on the left side.     Heart sounds: Normal heart sounds. No murmur heard.    No friction rub. No gallop.  Pulmonary:     Effort: Pulmonary effort is normal. No respiratory distress.     Breath sounds: Normal breath sounds. No stridor. No wheezing, rhonchi or rales.  Abdominal:     General: Abdomen is flat. Bowel sounds are normal. There is no distension.     Palpations: Abdomen is soft. There is no hepatomegaly or splenomegaly.     Tenderness: There is no abdominal tenderness. There is no right CVA tenderness, left CVA tenderness, guarding or rebound.  Musculoskeletal:        General: Normal range of motion.     Cervical back: Normal range of motion.     Right lower leg: No edema.     Left lower leg: No edema.  Skin:    General: Skin is warm and dry.     Coloration: Skin is not jaundiced.     Findings: No bruising, lesion or rash.     Nails: There is no clubbing.  Neurological:     Mental Status: He is alert.     Cranial Nerves: No dysarthria or facial asymmetry.     Motor: No tremor or abnormal muscle tone.     Gait: Gait normal.  Psychiatric:        Mood and Affect: Mood normal.        Speech: Speech normal.        Behavior: Behavior normal. Behavior is cooperative.        Judgment: Judgment normal.      Results for orders placed or performed during the hospital encounter of 04/07/20  Basic metabolic panel  Result Value Ref Range   Sodium 137 135 - 145 mmol/L   Potassium 3.8 3.5 - 5.1 mmol/L   Chloride 99  98 - 111 mmol/L   CO2 25 22 - 32 mmol/L   Glucose, Bld 105 (H) 70 - 99 mg/dL   BUN 11 6 - 20 mg/dL   Creatinine, Ser 4.09  0.61 - 1.24 mg/dL   Calcium 8.6 (L) 8.9 - 10.3 mg/dL   GFR calc non Af Amer >60 >60 mL/min   GFR calc Af Amer >60 >60 mL/min   Anion gap 13 5 - 15  CBC  Result Value Ref Range   WBC 7.5 4.0 - 10.5 K/uL   RBC 5.49 4.22 - 5.81 MIL/uL   Hemoglobin 16.3 13.0 - 17.0 g/dL   HCT 02.5 42.7 - 06.2 %   MCV 84.2 80.0 - 100.0 fL   MCH 29.7 26.0 - 34.0 pg   MCHC 35.3 30.0 - 36.0 g/dL   RDW 37.6 28.3 - 15.1 %   Platelets 171 150 - 400 K/uL   nRBC 0.0 0.0 - 0.2 %  Procalcitonin - Baseline  Result Value Ref Range   Procalcitonin 0.12 ng/mL  Fibrin derivatives D-Dimer ([facility] only)  Result Value Ref Range   Fibrin derivatives D-dimer ([facility]) 974.99 (H) 0.00 - 499.00 ng/mL (FEU)  Troponin I (High Sensitivity)  Result Value Ref Range   Troponin I (High Sensitivity) 3 <18 ng/L  Troponin I (High Sensitivity)  Result Value Ref Range   Troponin I (High Sensitivity) 4 <18 ng/L       Assessment & Plan:     ICD-10-CM   1. Elevated LFTs  R79.89 COMPLETE METABOLIC PANEL WITH GFR    Lipase    Gamma GT    Amylase    Hepatitis C antibody   years ago some mild elevation, labs a few weeks ago very high, will recheck labs, no current jaundice, no recent viral illness or GI sx    2. Elevated bilirubin  R17 COMPLETE METABOLIC PANEL WITH GFR    Lipase    Gamma GT    Amylase    Hepatitis C antibody   recent labs    3. Elevated alkaline phosphatase level  R74.8 COMPLETE METABOLIC PANEL WITH GFR    Lipase    Gamma GT    Amylase    Hepatitis C antibody   high alk phos and GGT - likely liver and not related to bones - will recheck labs     Pts labs per his work showed high AST ALT alk phos, GGT and mild bili elevation Possibly he was taking some sort of OTC/herbal "cleanser" no other specific supplements/vitamins/herbal, no tylenol no ETOH He had colecystectomy with bile leak  and open abd surgery 4 years ago with brief increase in LFTs that resolved and returned to only mild LFT elevation.  He did MRCP and everything in the past was done at [facility] without any need for continued f/up.  Will recheck labs, added on pancreatic enzymes, pt advised no tylenol, no ETOH, stop any and all otc meds/supplements and be careful not to take or use anything that would stress liver  F/up depends on lab results     Danelle Berry, PA-C 02/07/23 1:48 PM

## 2023-02-09 ENCOUNTER — Encounter: Payer: Self-pay | Admitting: Family Medicine

## 2023-02-09 ENCOUNTER — Other Ambulatory Visit: Payer: Self-pay | Admitting: Family Medicine

## 2023-02-09 DIAGNOSIS — R748 Abnormal levels of other serum enzymes: Secondary | ICD-10-CM

## 2023-02-09 DIAGNOSIS — R7989 Other specified abnormal findings of blood chemistry: Secondary | ICD-10-CM

## 2023-02-09 DIAGNOSIS — R17 Unspecified jaundice: Secondary | ICD-10-CM

## 2023-02-09 NOTE — Addendum Note (Signed)
Addended by: Danelle Berry on: 02/09/2023 05:56 PM   Modules accepted: Orders

## 2023-02-09 NOTE — Progress Notes (Signed)
Msg with Dr. Servando Snare He recommended hepatitis testing and trending labs   Labs ordered at [facility] since recent hepatitis panel labs have not been able to result or do reflex labs when needed.  Repeat labs in 1 week at [facility] outpt lab  Danelle Berry, PA-C

## 2023-02-16 ENCOUNTER — Other Ambulatory Visit
Admission: RE | Admit: 2023-02-16 | Discharge: 2023-02-16 | Disposition: A | Discharge status: Home / Self Care | Payer: BC Managed Care – PPO | Attending: Family Medicine | Admitting: Family Medicine

## 2023-02-16 DIAGNOSIS — R17 Unspecified jaundice: Secondary | ICD-10-CM | POA: Insufficient documentation

## 2023-02-16 DIAGNOSIS — R7989 Other specified abnormal findings of blood chemistry: Secondary | ICD-10-CM | POA: Diagnosis not present

## 2023-02-16 DIAGNOSIS — R748 Abnormal levels of other serum enzymes: Secondary | ICD-10-CM | POA: Insufficient documentation

## 2023-02-16 LAB — HEPATIC FUNCTION PANEL
ALT: 331 U/L — ABNORMAL HIGH (ref 0–44)
AST: 139 U/L — ABNORMAL HIGH (ref 15–41)
Albumin: 4.3 g/dL (ref 3.5–5.0)
Alkaline Phosphatase: 365 U/L — ABNORMAL HIGH (ref 38–126)
Bilirubin, Direct: 0.2 mg/dL (ref 0.0–0.2)
Indirect Bilirubin: 1 mg/dL — ABNORMAL HIGH (ref 0.3–0.9)
Total Bilirubin: 1.2 mg/dL (ref 0.3–1.2)
Total Protein: 7.8 g/dL (ref 6.5–8.1)

## 2023-02-16 LAB — HEPATITIS PANEL, ACUTE: Hepatitis B Surface Ag: NONREACTIVE

## 2023-02-17 ENCOUNTER — Telehealth: Payer: Self-pay

## 2023-02-17 DIAGNOSIS — R7989 Other specified abnormal findings of blood chemistry: Secondary | ICD-10-CM

## 2023-02-17 NOTE — Telephone Encounter (Signed)
-----   Message from Midge Minium sent at 02/16/2023  9:32 PM EDT ----- This patient has increase LFT's and needs a RUQ U/S, and blood work including ANA, SMA, AMA, alpha 1 antitrypsin., CMV, EBV, iron studies, ceruloplasmin and INR. Thanks

## 2023-02-18 NOTE — Addendum Note (Signed)
Addended by: Roena Malady on: 02/18/2023 12:44 PM   Modules accepted: Orders

## 2023-02-18 NOTE — Telephone Encounter (Signed)
Labs and ultrasound ordered

## 2023-03-03 DIAGNOSIS — M13812 Other specified arthritis, left shoulder: Secondary | ICD-10-CM | POA: Diagnosis not present

## 2023-03-10 NOTE — Telephone Encounter (Signed)
Pt stated that he is out of town and will get labs next week and schedule [place]

## 2023-03-11 DIAGNOSIS — R7989 Other specified abnormal findings of blood chemistry: Secondary | ICD-10-CM | POA: Diagnosis not present

## 2023-03-14 LAB — ANTI-SMOOTH MUSCLE ANTIBODY, IGG: Smooth Muscle Ab: 5 Units (ref 0–19)

## 2023-03-14 LAB — ANA: Anti Nuclear Antibody (ANA): NEGATIVE

## 2023-03-15 ENCOUNTER — Ambulatory Visit
Admission: RE | Admit: 2023-03-15 | Discharge: 2023-03-15 | Disposition: A | Discharge status: Home / Self Care | Payer: BC Managed Care – PPO | Source: Ambulatory Visit | Attending: Gastroenterology | Admitting: Gastroenterology

## 2023-03-15 DIAGNOSIS — Z9049 Acquired absence of other specified parts of digestive tract: Secondary | ICD-10-CM | POA: Diagnosis not present

## 2023-03-15 DIAGNOSIS — R7989 Other specified abnormal findings of blood chemistry: Secondary | ICD-10-CM | POA: Insufficient documentation

## 2023-03-16 ENCOUNTER — Other Ambulatory Visit: Payer: Self-pay

## 2023-03-16 DIAGNOSIS — R7989 Other specified abnormal findings of blood chemistry: Secondary | ICD-10-CM

## 2023-03-16 DIAGNOSIS — K838 Other specified diseases of biliary tract: Secondary | ICD-10-CM

## 2023-03-20 ENCOUNTER — Ambulatory Visit
Admission: RE | Admit: 2023-03-20 | Discharge: 2023-03-20 | Disposition: A | Discharge status: Home / Self Care | Payer: BC Managed Care – PPO | Source: Ambulatory Visit | Attending: Gastroenterology | Admitting: Gastroenterology

## 2023-03-20 ENCOUNTER — Other Ambulatory Visit: Payer: Self-pay | Admitting: Gastroenterology

## 2023-03-20 DIAGNOSIS — Z9049 Acquired absence of other specified parts of digestive tract: Secondary | ICD-10-CM | POA: Diagnosis not present

## 2023-03-20 DIAGNOSIS — K838 Other specified diseases of biliary tract: Secondary | ICD-10-CM

## 2023-03-20 DIAGNOSIS — R161 Splenomegaly, not elsewhere classified: Secondary | ICD-10-CM | POA: Diagnosis not present

## 2023-03-20 DIAGNOSIS — R7989 Other specified abnormal findings of blood chemistry: Secondary | ICD-10-CM

## 2023-03-20 MED ORDER — GADOBUTROL 1 MMOL/ML IV SOLN
9.0000 mL | Freq: Once | INTRAVENOUS | Status: AC | PRN
  Administered 2023-03-20: 9 mL via INTRAVENOUS

## 2023-03-22 ENCOUNTER — Telehealth: Payer: Self-pay | Admitting: Gastroenterology

## 2023-03-22 NOTE — Telephone Encounter (Signed)
A rep called in from radiology called in to see if we had result. Please call

## 2023-03-23 ENCOUNTER — Other Ambulatory Visit: Payer: Self-pay

## 2023-03-23 DIAGNOSIS — K8050 Calculus of bile duct without cholangitis or cholecystitis without obstruction: Secondary | ICD-10-CM

## 2023-03-23 NOTE — Telephone Encounter (Signed)
Tiffany with [facility] called back this morning and wanting to know if we have received the results for patient MRI MRCP. Informed her yes and Dr. Servando Snare has reviewed  the MRI MCRP result.

## 2023-03-28 ENCOUNTER — Other Ambulatory Visit: Payer: Self-pay

## 2023-03-28 ENCOUNTER — Inpatient Hospital Stay
Admission: EM | Admit: 2023-03-28 | Discharge: 2023-03-30 | DRG: 446 | Disposition: A | Discharge status: Home / Self Care | Payer: BC Managed Care – PPO | Attending: Internal Medicine | Admitting: Internal Medicine

## 2023-03-28 DIAGNOSIS — Z83438 Family history of other disorder of lipoprotein metabolism and other lipidemia: Secondary | ICD-10-CM | POA: Diagnosis not present

## 2023-03-28 DIAGNOSIS — E876 Hypokalemia: Secondary | ICD-10-CM | POA: Diagnosis present

## 2023-03-28 DIAGNOSIS — K8050 Calculus of bile duct without cholangitis or cholecystitis without obstruction: Principal | ICD-10-CM

## 2023-03-28 DIAGNOSIS — Z832 Family history of diseases of the blood and blood-forming organs and certain disorders involving the immune mechanism: Secondary | ICD-10-CM | POA: Diagnosis not present

## 2023-03-28 DIAGNOSIS — Z9049 Acquired absence of other specified parts of digestive tract: Secondary | ICD-10-CM

## 2023-03-28 DIAGNOSIS — R932 Abnormal findings on diagnostic imaging of liver and biliary tract: Secondary | ICD-10-CM | POA: Diagnosis not present

## 2023-03-28 DIAGNOSIS — R1011 Right upper quadrant pain: Secondary | ICD-10-CM | POA: Diagnosis not present

## 2023-03-28 DIAGNOSIS — Z4689 Encounter for fitting and adjustment of other specified devices: Secondary | ICD-10-CM | POA: Diagnosis not present

## 2023-03-28 DIAGNOSIS — Z803 Family history of malignant neoplasm of breast: Secondary | ICD-10-CM

## 2023-03-28 DIAGNOSIS — Z833 Family history of diabetes mellitus: Secondary | ICD-10-CM | POA: Diagnosis not present

## 2023-03-28 DIAGNOSIS — R109 Unspecified abdominal pain: Secondary | ICD-10-CM | POA: Diagnosis present

## 2023-03-28 DIAGNOSIS — Z8249 Family history of ischemic heart disease and other diseases of the circulatory system: Secondary | ICD-10-CM | POA: Diagnosis not present

## 2023-03-28 DIAGNOSIS — Z818 Family history of other mental and behavioral disorders: Secondary | ICD-10-CM

## 2023-03-28 DIAGNOSIS — R7401 Elevation of levels of liver transaminase levels: Secondary | ICD-10-CM

## 2023-03-28 DIAGNOSIS — R7989 Other specified abnormal findings of blood chemistry: Secondary | ICD-10-CM | POA: Diagnosis present

## 2023-03-28 LAB — COMPREHENSIVE METABOLIC PANEL
ALT: 302 U/L — ABNORMAL HIGH (ref 0–44)
AST: 146 U/L — ABNORMAL HIGH (ref 15–41)
Albumin: 4.4 g/dL (ref 3.5–5.0)
Alkaline Phosphatase: 580 U/L — ABNORMAL HIGH (ref 38–126)
Anion gap: 10 (ref 5–15)
BUN: 16 mg/dL (ref 6–20)
CO2: 27 mmol/L (ref 22–32)
Calcium: 9.4 mg/dL (ref 8.9–10.3)
Chloride: 101 mmol/L (ref 98–111)
Creatinine, Ser: 0.86 mg/dL (ref 0.61–1.24)
GFR, Estimated: >60 mL/min (ref >60)
Glucose, Bld: 92 mg/dL (ref 70–99)
Potassium: 3.9 mmol/L (ref 3.5–5.1)
Sodium: 138 mmol/L (ref 135–145)
Total Bilirubin: 2.4 mg/dL — ABNORMAL HIGH (ref 0.3–1.2)
Total Protein: 8.1 g/dL (ref 6.5–8.1)

## 2023-03-28 LAB — URINALYSIS, ROUTINE W REFLEX MICROSCOPIC
Bilirubin Urine: NEGATIVE
Glucose, UA: NEGATIVE mg/dL
Hgb urine dipstick: NEGATIVE
Ketones, ur: NEGATIVE mg/dL
Leukocytes,Ua: NEGATIVE
Nitrite: NEGATIVE
Protein, ur: NEGATIVE mg/dL
Specific Gravity, Urine: 1.017 (ref 1.005–1.030)
pH: 5 (ref 5.0–8.0)

## 2023-03-28 LAB — CBC
HCT: 46.7 % (ref 39.0–52.0)
Hemoglobin: 15.6 g/dL (ref 13.0–17.0)
MCH: 29.9 pg (ref 26.0–34.0)
MCHC: 33.4 g/dL (ref 30.0–36.0)
MCV: 89.5 fL (ref 80.0–100.0)
Platelets: 242 10*3/uL (ref 150–400)
RBC: 5.22 MIL/uL (ref 4.22–5.81)
RDW: 13.4 % (ref 11.5–15.5)
WBC: 10.2 10*3/uL (ref 4.0–10.5)
nRBC: 0 % (ref 0.0–0.2)

## 2023-03-28 LAB — LIPASE, BLOOD: Lipase: 35 U/L (ref 11–51)

## 2023-03-28 MED ORDER — ONDANSETRON HCL 4 MG/2ML IJ SOLN
4.0000 mg | Freq: Once | INTRAMUSCULAR | Status: AC
  Administered 2023-03-28: 4 mg via INTRAVENOUS
  Filled 2023-03-28: qty 2

## 2023-03-28 MED ORDER — HYDROCODONE-ACETAMINOPHEN 5-325 MG PO TABS
1.0000 | ORAL_TABLET | ORAL | Status: DC | PRN
  Administered 2023-03-28: 1 via ORAL
  Filled 2023-03-28: qty 1

## 2023-03-28 MED ORDER — KETOROLAC TROMETHAMINE 30 MG/ML IJ SOLN: 30.0000 mg | Freq: Four times a day (QID) | INTRAMUSCULAR | Status: DC | PRN

## 2023-03-28 MED ORDER — MORPHINE SULFATE (PF) 4 MG/ML IV SOLN
4.0000 mg | Freq: Once | INTRAVENOUS | Status: AC
  Administered 2023-03-28: 4 mg via INTRAVENOUS
  Filled 2023-03-28: qty 1

## 2023-03-28 MED ORDER — ONDANSETRON HCL 4 MG/2ML IJ SOLN: 4.0000 mg | Freq: Four times a day (QID) | INTRAMUSCULAR | Status: DC | PRN

## 2023-03-28 MED ORDER — SODIUM CHLORIDE 0.9 % IV SOLN: INTRAVENOUS | Status: DC

## 2023-03-28 MED ORDER — ACETAMINOPHEN 650 MG RE SUPP: 650.0000 mg | Freq: Four times a day (QID) | RECTAL | Status: DC | PRN

## 2023-03-28 MED ORDER — ONDANSETRON HCL 4 MG PO TABS: 4.0000 mg | ORAL_TABLET | Freq: Four times a day (QID) | ORAL | Status: DC | PRN

## 2023-03-28 MED ORDER — ACETAMINOPHEN 325 MG PO TABS: 650.0000 mg | ORAL_TABLET | Freq: Four times a day (QID) | ORAL | Status: DC | PRN

## 2023-03-28 NOTE — ED Provider Notes (Signed)
[facility] Provider Note    Event Date/Time   First MD Initiated Contact with Patient 03/28/23 1424     (approximate)   History   Flank Pain   HPI  Elimelech Merkin is a 37 y.o. male with a history of cholecystectomy who presents with complaints of right upper quadrant abdominal pain.  Patient has had outpatient workup for elevated LFTs including an MRCP recently which demonstrates bile duct stones in the pancreatic portion.     Physical Exam   Triage Vital Signs: ED Triage Vitals  Encounter Vitals Group     BP 03/28/23 1358 121/79     Systolic BP Percentile --      Diastolic BP Percentile --      Pulse Rate 03/28/23 1358 90     Resp 03/28/23 1358 18     Temp 03/28/23 1358 99.5 F (37.5 C)     Temp Source 03/28/23 1358 Oral     SpO2 03/28/23 1358 100 %     Weight 03/28/23 1359 91.2 kg (201 lb)     Height 03/28/23 1359 1.803 m (5\' 11" )     Head Circumference --      Peak Flow --      Pain Score 03/28/23 1358 5     Pain Loc --      Pain Education --      Exclude from Growth Chart --     Most recent vital signs: Vitals:   03/28/23 1358  BP: 121/79  Pulse: 90  Resp: 18  Temp: 99.5 F (37.5 C)  SpO2: 100%     General: Awake, no distress.  CV:  Good peripheral perfusion.  Resp:  Normal effort.  Abd:  No distention.  Tenderness to palpation in the right upper quadrant Other:     ED Results / Procedures / Treatments   Labs (all labs ordered are listed, but only abnormal results are displayed) Labs Reviewed  COMPREHENSIVE METABOLIC PANEL - Abnormal; Notable for the following components:      Result Value   AST 146 (*)    ALT 302 (*)    Alkaline Phosphatase 580 (*)    Total Bilirubin 2.4 (*)    All other components within normal limits  URINALYSIS, ROUTINE W REFLEX MICROSCOPIC - Abnormal; Notable for the following components:   Color, Urine YELLOW (*)    APPearance CLEAR (*)    All other components within normal limits   LIPASE, BLOOD  CBC     EKG    RADIOLOGY     PROCEDURES:  Critical Care performed:   Procedures   MEDICATIONS ORDERED IN ED: Medications  morphine (PF) 4 MG/ML injection 4 mg (has no administration in time range)  ondansetron (ZOFRAN) injection 4 mg (has no administration in time range)     IMPRESSION / MDM / ASSESSMENT AND PLAN / ED COURSE  I reviewed the triage vital signs and the nursing notes. Patient's presentation is most consistent with acute presentation with potential threat to life or bodily function.  Patient presents with right upper quadrant abdominal pain, MRCP performed on September 8 demonstrates bile duct stones, patient now has elevation of total bilirubin of 2.4  Discussed case with Dr. Servando Snare of GI, he recommends admission for ERCP in the morning  Will treat patient with IV morphine, IV Zofran, n.p.o. after midnight.        FINAL CLINICAL IMPRESSION(S) / ED DIAGNOSES   Final diagnoses:  Choledocholithiasis  Rx / DC Orders   ED Discharge Orders     None        Note:  This document was prepared using Dragon voice recognition software and may include unintentional dictation errors.   Jene Every, MD 03/28/23 1459

## 2023-03-28 NOTE — ED Triage Notes (Signed)
Pt presents to the ED POV from home for bilateral side pain and bilateral lower back pain that started today. Pt states that the pain is prominently in his right flank. Pt states that he had his gallbladder removed in 2020. Pt had a scan recently that showed three stones in his bile duct that is causing inflammation in the bile duct. Pt also reports recent fevers.

## 2023-03-28 NOTE — ED Notes (Signed)
Pt transferred from ED stretcher to hospital bed. Pt able to get out of bed and back to bed independently.

## 2023-03-28 NOTE — H&P (Signed)
History and Physical    Patient: William Dennis XBM:[id number] DOB: 1985-10-29 DOA: 03/28/2023 DOS: the patient was seen and examined on 03/28/2023 PCP: Danelle Berry, PA-C  Patient coming from: Home  Chief Complaint:  Chief Complaint  Patient presents with   Flank Pain   HPI: William Dennis is a 37 y.o. male with no significant past medical history other than cholecystectomy secondary to gallstones in 2020 with postop bile leak which resolved comes to the emergency room after patient was noted to have abnormal LFTs as part of routine labs done from work. Patient underwent MRCP ordered by [facility] Dr. Servando Snare patient was supposed to have ERCP as outpatient however he started having right-sided abdominal/flank pain and came to the emergency room given positive results of CBD stones noted on MRCP done as outpatient on September 8.  Patient was noted to have elevated LFTs and bilirubin of 2.4. Dr. Roderic Ovens spoke with [facility] Dr.wohl and patient is being admitted for ERCP which will be done tomorrow. No family at bedside. Patient at present is not having any abdominal pain.  He denies any fever nausea vomiting or chest pain. Patient is being admitted for abdominal pain secondary to Choledocholithiasis.    Review of Systems: As mentioned in the history of present illness. All other systems reviewed and are negative. Past Medical History:  Diagnosis Date   Acute cholecystitis 08/25/2018   Anxiety    Cholecystitis    Common bile duct leak    Migraines    Past Surgical History:  Procedure Laterality Date   CHOLECYSTECTOMY N/A 08/26/2018   Procedure: LAPAROSCOPIC CHOLECYSTECTOMY changed to open;  Surgeon: Sung Amabile, DO;  Location: [facility] ORS;  Service: General;  Laterality: N/A;   CHOLECYSTECTOMY  08/26/2018   Procedure: CHOLECYSTECTOMY;  Surgeon: Sung Amabile, DO;  Location: [facility] ORS;  Service: General;;   ERCP N/A 08/28/2018   Procedure: ENDOSCOPIC RETROGRADE CHOLANGIOPANCREATOGRAPHY (ERCP);  Surgeon:  Midge Minium, MD;  Location: [facility] ENDOSCOPY;  Service: Endoscopy;  Laterality: N/A;   Social History:  reports that he has never smoked. He has never used smokeless tobacco. He reports that he does not currently use alcohol. He reports that he does not use drugs.  Allergies  Allergen Reactions   Codeine Anxiety    Family History  Problem Relation Age of Onset   Depression Mother    Anxiety disorder Mother    Diabetes Father    Bleeding Disorder Father    Hyperlipidemia Father    Hypertension Father    Diabetes Sister    Depression Sister    Heart attack Maternal Grandfather 66   Breast cancer Paternal Grandmother    Depression Sister    Anxiety disorder Sister     Prior to Admission medications   Not on File    Physical Exam: Vitals:   03/28/23 1358 03/28/23 1359  BP: 121/79   Pulse: 90   Resp: 18   Temp: 99.5 F (37.5 C)   TempSrc: Oral   SpO2: 100%   Weight:  91.2 kg  Height:  5\' 11"  (1.803 m)  Physical Exam Constitutional:      Appearance: Normal appearance.  HENT:     Head: Normocephalic and atraumatic.  Cardiovascular:     Rate and Rhythm: Normal rate and regular rhythm.  Pulmonary:     Effort: Pulmonary effort is normal.     Breath sounds: Normal breath sounds.  Abdominal:     General: Abdomen is flat. Bowel sounds are normal.     Palpations:  Abdomen is soft.  Neurological:     General: No focal deficit present.     Mental Status: He is alert and oriented to person, place, and time.     Assessment and Plan:  William Dennis is a 37 y.o. male with no significant past medical history other than cholecystectomy secondary to gallstones in 2020 with postop bile leak which resolved comes to the emergency room after patient was noted to have abnormal LFTs as part of routine labs done from work. Patient underwent MRCP ordered by [facility] Dr. Servando Snare patient was supposed to have ERCP as outpatient however he started having right-sided abdominal/flank pain and came  to the emergency room given positive results of CBD stones noted on MRCP done as outpatient on September 8.  Abdominal pain secondary to CBD stone -- patient came in with right sided flank pain. -- MRCP done as outpatient shows CBD stones. -- LFTs elevated with elevated bilirubin --[facility] consultation for ERCP plan for tomorrow  -- IV fluids, IV and PO PRN pain meds. -- NPO for midnight  DVT prophylaxis ambulation  No family at bedside. About was discussed with patient and he is in agreement with plan. Questions answered.   Advance Care Planning:   Code Status: Full Code   Consults: GI dr Servando Snare  Family Communication: no family in ER  Severity of Illness: The appropriate patient status for this patient is INPATIENT. Inpatient status is judged to be reasonable and necessary in order to provide the required intensity of service to ensure the patient's safety. The patient's presenting symptoms, physical exam findings, and initial radiographic and laboratory data in the context of their chronic comorbidities is felt to place them at high risk for further clinical deterioration. Furthermore, it is not anticipated that the patient will be medically stable for discharge from the hospital within 2 midnights of admission.   * I certify that at the point of admission it is my clinical judgment that the patient will require inpatient hospital care spanning beyond 2 midnights from the point of admission due to high intensity of service, high risk for further deterioration and high frequency of surveillance required.*  Author: Enedina Finner, MD 03/28/2023 3:52 PM  For on call review [email or web address].

## 2023-03-29 ENCOUNTER — Encounter: Payer: Self-pay | Admitting: Internal Medicine

## 2023-03-29 ENCOUNTER — Encounter
Admission: EM | Disposition: A | Discharge status: Home / Self Care | Payer: Self-pay | Source: Home / Self Care | Attending: Internal Medicine

## 2023-03-29 ENCOUNTER — Inpatient Hospital Stay: Payer: BC Managed Care – PPO | Admitting: Certified Registered"

## 2023-03-29 ENCOUNTER — Inpatient Hospital Stay: Payer: BC Managed Care – PPO

## 2023-03-29 DIAGNOSIS — Z4689 Encounter for fitting and adjustment of other specified devices: Secondary | ICD-10-CM

## 2023-03-29 DIAGNOSIS — R7989 Other specified abnormal findings of blood chemistry: Secondary | ICD-10-CM

## 2023-03-29 DIAGNOSIS — K8050 Calculus of bile duct without cholangitis or cholecystitis without obstruction: Secondary | ICD-10-CM | POA: Diagnosis not present

## 2023-03-29 DIAGNOSIS — R932 Abnormal findings on diagnostic imaging of liver and biliary tract: Secondary | ICD-10-CM | POA: Diagnosis not present

## 2023-03-29 HISTORY — PX: GASTROINTESTINAL STENT REMOVAL: SHX6384

## 2023-03-29 HISTORY — PX: ERCP: SHX5425

## 2023-03-29 SURGERY — ERCP, WITH INTERVENTION IF INDICATED
Anesthesia: General

## 2023-03-29 MED ORDER — DICLOFENAC SUPPOSITORY 100 MG
RECTAL | Status: AC
  Filled 2023-03-29: qty 1

## 2023-03-29 MED ORDER — GLYCOPYRROLATE 0.2 MG/ML IJ SOLN
INTRAMUSCULAR | Status: AC
  Filled 2023-03-29: qty 1

## 2023-03-29 MED ORDER — DEXMEDETOMIDINE HCL IN NACL 80 MCG/20ML IV SOLN
INTRAVENOUS | Status: DC | PRN
Start: 2023-03-29 — End: 2023-03-29
  Administered 2023-03-29 (×2): 8 ug via INTRAVENOUS

## 2023-03-29 MED ORDER — PROPOFOL 10 MG/ML IV BOLUS
INTRAVENOUS | Status: AC
  Filled 2023-03-29: qty 20

## 2023-03-29 MED ORDER — GLYCOPYRROLATE 0.2 MG/ML IJ SOLN
INTRAMUSCULAR | Status: DC | PRN
  Administered 2023-03-29: .1 mg via INTRAVENOUS

## 2023-03-29 MED ORDER — LIDOCAINE HCL (PF) 2 % IJ SOLN
INTRAMUSCULAR | Status: AC
  Filled 2023-03-29: qty 5

## 2023-03-29 MED ORDER — PROPOFOL 10 MG/ML IV BOLUS
INTRAVENOUS | Status: DC | PRN
Start: 2023-03-29 — End: 2023-03-29
  Administered 2023-03-29: 50 mg via INTRAVENOUS
  Administered 2023-03-29: 150 ug/kg/min via INTRAVENOUS
  Administered 2023-03-29: 150 mg via INTRAVENOUS

## 2023-03-29 MED ORDER — SODIUM CHLORIDE 0.9 % IV SOLN: INTRAVENOUS | Status: DC

## 2023-03-29 MED ORDER — DICLOFENAC SUPPOSITORY 100 MG
100.0000 mg | Freq: Once | RECTAL | Status: AC
  Administered 2023-03-29: 100 mg via RECTAL
  Filled 2023-03-29: qty 1

## 2023-03-29 MED ORDER — LIDOCAINE HCL (CARDIAC) PF 100 MG/5ML IV SOSY
PREFILLED_SYRINGE | INTRAVENOUS | Status: DC | PRN
  Administered 2023-03-29: 100 mg via INTRAVENOUS

## 2023-03-29 NOTE — Progress Notes (Signed)
Transition of Care [facility]) - Inpatient Brief Assessment   Patient Details  Name: William Dennis MRN: [id number] Date of Birth: 25-Apr-1986  Transition of Care [facility]) CM/SW Contact:    Darolyn Rua, LCSW Phone Number: 03/29/2023, 2:06 PM   Clinical Narrative:  PCP: Danelle Berry, PA Insurance: BC[employer] Comm PPO No TOC needs identified   Transition of Care Asessment: Insurance and Status: Insurance coverage has been reviewed Patient has primary care physician: Yes Home environment has been reviewed: from home Prior level of function:: independent Prior/Current Home Services: No current home services Social Determinants of Health Reivew: SDOH reviewed no interventions necessary Readmission risk has been reviewed: Yes Transition of care needs: no transition of care needs at this time

## 2023-03-29 NOTE — Anesthesia Postprocedure Evaluation (Signed)
Anesthesia Post Note  Patient: [name]  Procedure(s) Performed: ENDOSCOPIC RETROGRADE CHOLANGIOPANCREATOGRAPHY (ERCP) GASTROINTESTINAL STENT REMOVAL  Patient location during evaluation: PACU Anesthesia Type: General Level of consciousness: awake and alert Pain management: pain level controlled Vital Signs Assessment: post-procedure vital signs reviewed and stable Respiratory status: spontaneous breathing, nonlabored ventilation, respiratory function stable and patient connected to nasal cannula oxygen Cardiovascular status: blood pressure returned to baseline and stable Postop Assessment: no apparent nausea or vomiting Anesthetic complications: no  No notable events documented.   Last Vitals:  Vitals:   03/29/23 1240 03/29/23 1307  BP: 103/67 108/66  Pulse: 76 73  Resp: 16 18  Temp:  36.9 C  SpO2: 96% 96%    Last Pain:  Vitals:   03/29/23 1307  TempSrc: Oral  PainSc:                  Stephanie Coup

## 2023-03-29 NOTE — Progress Notes (Signed)
Progress Note   Patient: William Dennis ZOX:[id number] DOB: [birth year] DOA: 03/28/2023     1 DOS: the patient was seen and examined on 03/29/2023   Brief hospital course: 37yo with significant PMH other than cholecystectomy in 2020 with post-op bile leak who presented on 9/16 with abdominal pain.  He had prior routine LFTs that were abnormal and MRCP on 9/8 showed CBD stones; he was scheduled for outpatient ERCP but developed symptoms.  Plan for ERCP with Dr. Servando Snare on 9/17.  Assessment and Plan:  Symptomatic choledocholithiasis Prior h/o cholecystomy Presented with abdominal pain, imaging concerning for recurrent choledocholithiasis ERCP performed on 9/17:  - A filling defect consistent with a stone was seen on the cholangiogram.  - Choledocholithiasis was found. Complete removal was accomplished by balloon extraction.  - One stent was removed from the common bile duct.  - The biliary tree was swept and nothing was found. Will monitor overnight for complications and likely dc to home tomorrow  Abnormal LFTs Related to above Anticipate resolution now that this has been treated Repeat CMP in AM      Consultants: GI  Procedures: ERCP 9/17  Antibiotics: None  30 Day Unplanned Readmission Risk Score    Flowsheet Row ED to Hosp-Admission (Current) from 03/28/2023 in [facility]  30 Day Unplanned Readmission Risk Score (%) 3.93 Filed at 03/29/2023 0401       This score is the patient's risk of an unplanned readmission within 30 days of being discharged (0 -100%). The score is based on dignosis, age, lab data, medications, orders, and past utilization.   Low:  0-14.9   Medium: 15-21.9   High: 22-29.9   Extreme: 30 and above           Subjective: Feeling better today with less pain but ongoing TTP.  He was going for ERCP just after my evaluation.  Successful procedure today, likely dc to home tomorrow.   Objective: Vitals:   03/29/23  1240 03/29/23 1307  BP: 103/67 108/66  Pulse: 76 73  Resp: 16 18  Temp:  98.5 F (36.9 C)  SpO2: 96% 96%    Intake/Output Summary (Last 24 hours) at 03/29/2023 1313 Last data filed at 03/29/2023 1207 Gross per 24 hour  Intake 450 ml  Output 0 ml  Net 450 ml   Filed Weights   03/28/23 1359  Weight: 91.2 kg    Exam:  General:  Appears calm and comfortable and is in NAD Eyes:   EOMI, normal lids, iris ENT:  grossly normal hearing, lips & tongue, mmm Neck:  no LAD, masses or thyromegaly Cardiovascular:  RRR, no m/r/g. No LE edema.  Respiratory:   CTA bilaterally with no wheezes/rales/rhonchi.  Normal respiratory effort. Abdomen:  soft, LUQ/epigastric TTP, ND Skin:  no rash or induration seen on limited exam Musculoskeletal:  grossly normal tone BUE/BLE, good ROM, no bony abnormality Psychiatric:  grossly normal mood and affect, speech fluent and appropriate, AOx3 Neurologic:  CN 2-12 grossly intact, moves all extremities in coordinated fashion, sensation intact  Data Reviewed: I have reviewed the patient's lab results since admission.  Pertinent labs from admission include:   AP 580 AST 146/ALT 302/Bili 2.4 Normal CBC     Family Communication: Wife, mother were present throughout evaluation  Disposition: Status is: Inpatient Remains inpatient appropriate because: monitoring post-procedure     Time spent: 35 minutes  Unresulted Labs (From admission, onward)     Start     Ordered  03/30/23 0500  CBC with Differential/Platelet  Tomorrow morning,   R        03/29/23 0753   03/30/23 0500  Comprehensive metabolic panel  Tomorrow morning,   R        03/29/23 0753   03/28/23 1911  HIV Antibody (routine testing w rflx)  (HIV Antibody (Routine testing w reflex) panel)  Once,   R        03/28/23 1910             Author: Jonah Blue, MD 03/29/2023 1:13 PM  For on call review [email or web address].

## 2023-03-29 NOTE — Transfer of Care (Signed)
Immediate Anesthesia Transfer of Care Note  Patient: William Dennis  Procedure(s) Performed: ENDOSCOPIC RETROGRADE CHOLANGIOPANCREATOGRAPHY (ERCP) GASTROINTESTINAL STENT REMOVAL  Patient Location: Endoscopy Unit  Anesthesia Type:General  Level of Consciousness: awake, alert , and oriented  Airway & Oxygen Therapy: Patient Spontanous Breathing  Post-op Assessment: Report given to RN and Post -op Vital signs reviewed and stable  Post vital signs: Reviewed and stable  Last Vitals:  Vitals Value Taken Time  BP 116/65 03/29/23 1219  Temp 36.8 C 03/29/23 1218  Pulse 88 03/29/23 1220  Resp 18 03/29/23 1220  SpO2 96 % 03/29/23 1220  Vitals shown include unfiled device data.  Last Pain:  Vitals:   03/29/23 1218  TempSrc: Tympanic  PainSc: 0-No pain         Complications: No notable events documented.

## 2023-03-29 NOTE — Op Note (Signed)
[facility]ology Patient Name: William Dennis Procedure Date: 03/29/2023 11:40 AM MRN: [id number] Account #: [id number] Date of Birth: 1986-03-19 Admit Type: Inpatient Age: 37 Room: [facility] ENDO ROOM 4 Gender: Male Note Status: Finalized Instrument Name: [id number] Procedure:             ERCP Indications:           Bile duct stone(s), Stent removal Providers:             Midge Minium MD, MD Medicines:             Propofol per Anesthesia Complications:         No immediate complications. Procedure:             Pre-Anesthesia Assessment:                        - Prior to the procedure, a History and Physical was                         performed, and patient medications and allergies were                         reviewed. The patient's tolerance of previous                         anesthesia was also reviewed. The risks and benefits                         of the procedure and the sedation options and risks                         were discussed with the patient. All questions were                         answered, and informed consent was obtained. Prior                         Anticoagulants: The patient has taken no anticoagulant                         or antiplatelet agents. ASA Grade Assessment: II - A                         patient with mild systemic disease. After reviewing                         the risks and benefits, the patient was deemed in                         satisfactory condition to undergo the procedure.                        After obtaining informed consent, the scope was passed                         under direct vision. Throughout the procedure, the                         patient's blood  pressure, pulse, and oxygen                         saturations were monitored continuously. The                         Duodenoscope was introduced through the mouth, and                         used to inject contrast into and used to  inject                         contrast into the bile duct. The ERCP was accomplished                         without difficulty. The patient tolerated the                         procedure well. Findings:      A scout film of the abdomen was obtained. One stent ending in the main       bile duct was seen. One stent was removed from the common bile duct       using a snare. The bile duct was deeply cannulated with the short-nosed       traction sphincterotome. Contrast was injected. I personally interpreted       the bile duct images. There was brisk flow of contrast through the       ducts. Image quality was excellent. Contrast extended to the entire       biliary tree. The main bile duct contained filling defect(s) thought to       be a stone. A wire was passed into the biliary tree. The biliary tree       was swept with a 15 mm balloon starting at the bifurcation. Sludge was       swept from the duct. All stones were removed. Nothing was found. Impression:            - A filling defect consistent with a stone was seen on                         the cholangiogram.                        - Choledocholithiasis was found. Complete removal was                         accomplished by balloon extraction.                        - One stent was removed from the common bile duct.                        - The biliary tree was swept and nothing was found. Recommendation:        - Return patient to hospital ward for ongoing care.                        - Resume previous diet.                        -  Watch for pancreatitis, bleeding, perforation, and                         cholangitis. Procedure Code(s):     --- Professional ---                        [id number], Endoscopic retrograde cholangiopancreatography                         (ERCP); with removal of foreign body(s) or stent(s)                         from biliary/pancreatic duct(s)                        43264, Endoscopic retrograde  cholangiopancreatography                         (ERCP); with removal of calculi/debris from                         biliary/pancreatic duct(s)                        13244, Endoscopic catheterization of the biliary                         ductal system, radiological supervision and                         interpretation Diagnosis Code(s):     --- Professional ---                        K80.50, Calculus of bile duct without cholangitis or                         cholecystitis without obstruction                        R93.2, Abnormal findings on diagnostic imaging of                         liver and biliary tract CPT copyright 2022 American Medical Association. All rights reserved. The codes documented in this report are preliminary and upon coder review may  be revised to meet current compliance requirements. Midge Minium MD, MD 03/29/2023 12:15:26 PM This report has been signed electronically. Number of Addenda: 0 Note Initiated On: 03/29/2023 11:40 AM Estimated Blood Loss:  Estimated blood loss: none.      [facility]

## 2023-03-29 NOTE — Hospital Course (Signed)
37yo with significant PMH other than cholecystectomy in 2020 with post-op bile leak who presented on 9/16 with abdominal pain.  He had prior routine LFTs that were abnormal and MRCP on 9/8 showed CBD stones; he was scheduled for outpatient ERCP but developed symptoms.  Plan for ERCP with Dr. Servando Snare on 9/17.

## 2023-03-29 NOTE — Anesthesia Preprocedure Evaluation (Signed)
Anesthesia Evaluation  Patient identified by MRN, date of birth, ID band Patient awake    Reviewed: Allergy & Precautions, NPO status , Patient's Chart, lab work & pertinent test results  Airway Mallampati: III  TM Distance: >3 FB Neck ROM: full    Dental  (+) Chipped, Dental Advidsory Given   Pulmonary neg pulmonary ROS   Pulmonary exam normal        Cardiovascular negative cardio ROS Normal cardiovascular exam     Neuro/Psych negative neurological ROS  negative psych ROS   GI/Hepatic Neg liver ROS,GERD  ,,  Endo/Other  negative endocrine ROS    Renal/GU negative Renal ROS  negative genitourinary   Musculoskeletal   Abdominal   Peds  Hematology negative hematology ROS (+)   Anesthesia Other Findings Past Medical History: 08/25/2018: Acute cholecystitis No date: Anxiety No date: Cholecystitis No date: Common bile duct leak No date: Migraines  Past Surgical History: 08/26/2018: CHOLECYSTECTOMY; N/A     Comment:  Procedure: LAPAROSCOPIC CHOLECYSTECTOMY changed to open;              Surgeon: Sung Amabile, DO;  Location: [facility] ORS;  Service:              General;  Laterality: N/A; 08/26/2018: CHOLECYSTECTOMY     Comment:  Procedure: CHOLECYSTECTOMY;  Surgeon: Sung Amabile, DO;               Location: [facility] ORS;  Service: General;; 08/28/2018: ERCP; N/A     Comment:  Procedure: ENDOSCOPIC RETROGRADE               CHOLANGIOPANCREATOGRAPHY (ERCP);  Surgeon: Midge Minium,               MD;  Location: [facility] ENDOSCOPY;  Service: Endoscopy;                Laterality: N/A;  BMI    Body Mass Index: 28.03 kg/m      Reproductive/Obstetrics negative OB ROS                             Anesthesia Physical Anesthesia Plan  ASA: 2  Anesthesia Plan: General   Post-op Pain Management: Minimal or no pain anticipated   Induction: Intravenous  PONV Risk Score and Plan: 3 and Propofol infusion,  TIVA and Ondansetron  Airway Management Planned: Nasal Cannula  Additional Equipment: None  Intra-op Plan:   Post-operative Plan:   Informed Consent: I have reviewed the patients History and Physical, chart, labs and discussed the procedure including the risks, benefits and alternatives for the proposed anesthesia with the patient or authorized representative who has indicated his/her understanding and acceptance.     Dental advisory given  Plan Discussed with: CRNA and Surgeon  Anesthesia Plan Comments: (Discussed risks of anesthesia with patient, including possibility of difficulty with spontaneous ventilation under anesthesia necessitating airway intervention, PONV, and rare risks such as cardiac or respiratory or neurological events, and allergic reactions. Discussed the role of CRNA in patient's perioperative care. Patient understands.)       Anesthesia Quick Evaluation

## 2023-03-30 ENCOUNTER — Other Ambulatory Visit: Payer: BC Managed Care – PPO

## 2023-03-30 ENCOUNTER — Encounter: Payer: Self-pay | Admitting: Gastroenterology

## 2023-03-30 DIAGNOSIS — K8050 Calculus of bile duct without cholangitis or cholecystitis without obstruction: Secondary | ICD-10-CM | POA: Diagnosis not present

## 2023-03-30 LAB — COMPREHENSIVE METABOLIC PANEL WITH GFR
ALT: 190 U/L — ABNORMAL HIGH (ref 0–44)
AST: 55 U/L — ABNORMAL HIGH (ref 15–41)
Albumin: 3.3 g/dL — ABNORMAL LOW (ref 3.5–5.0)
Alkaline Phosphatase: 433 U/L — ABNORMAL HIGH (ref 38–126)
Anion gap: 10 (ref 5–15)
BUN: 17 mg/dL (ref 6–20)
CO2: 28 mmol/L (ref 22–32)
Calcium: 9 mg/dL (ref 8.9–10.3)
Chloride: 103 mmol/L (ref 98–111)
Creatinine, Ser: 0.92 mg/dL (ref 0.61–1.24)
GFR, Estimated: >60 mL/min
Glucose, Bld: 101 mg/dL — ABNORMAL HIGH (ref 70–99)
Potassium: 3.3 mmol/L — ABNORMAL LOW (ref 3.5–5.1)
Sodium: 141 mmol/L (ref 135–145)
Total Bilirubin: 3.2 mg/dL — ABNORMAL HIGH (ref 0.3–1.2)
Total Protein: 6.6 g/dL (ref 6.5–8.1)

## 2023-03-30 MED ORDER — POTASSIUM CHLORIDE CRYS ER 20 MEQ PO TBCR
40.0000 meq | EXTENDED_RELEASE_TABLET | Freq: Once | ORAL | Status: AC
  Administered 2023-03-30: 40 meq via ORAL
  Filled 2023-03-30: qty 2

## 2023-03-30 NOTE — Progress Notes (Signed)
Discharge instructions were reviewed with patient. Questions were encourage and answered. IV was taken out.

## 2023-03-30 NOTE — Plan of Care (Signed)
Problem: Health Behavior/Discharge Planning: Goal: Ability to manage health-related needs will improve Outcome: Adequate for Discharge   Problem: Clinical Measurements: Goal: Ability to maintain clinical measurements within normal limits will improve Outcome: Adequate for Discharge   Problem: Clinical Measurements: Goal: Will remain free from infection Outcome: Adequate for Discharge   Problem: Clinical Measurements: Goal: Diagnostic test results will improve Outcome: Adequate for Discharge   Problem: Clinical Measurements: Goal: Respiratory complications will improve Outcome: Adequate for Discharge   Problem: Clinical Measurements: Goal: Cardiovascular complication will be avoided Outcome: Adequate for Discharge

## 2023-03-30 NOTE — Discharge Summary (Signed)
Physician Discharge Summary   Patient: William Dennis MRN: [id number] DOB: 06/07/1986  Admit date:     03/28/2023  Discharge date: 03/30/23  Discharge Physician: Jonah Blue   PCP: Danelle Berry, PA-C   Recommendations at discharge:   Follow up with PA Tapia in 1-2 weeks, will need repeat CMP  Discharge Diagnoses: Principal Problem:   Choledocholithiasis Active Problems:   Transaminitis    Hospital Course: 37yo with significant PMH other than cholecystectomy in 2020 with post-op bile leak who presented on 9/16 with abdominal pain.  He had prior routine LFTs that were abnormal and MRCP on 9/8 showed CBD stones; he was scheduled for outpatient ERCP but developed symptoms.  Plan for ERCP with Dr. Servando Snare on 9/17.  Assessment and Plan:  Symptomatic choledocholithiasis Prior h/o cholecystomy Presented with abdominal pain, imaging concerning for recurrent choledocholithiasis ERCP performed on 9/17:  - A filling defect consistent with a stone was seen on the cholangiogram.  - Choledocholithiasis was found. Complete removal was accomplished by balloon extraction.  - One stent was removed from the common bile duct.  - The biliary tree was swept and nothing was found. No post-operative complications  Will dc to home   Abnormal LFTs Related to above Anticipate resolution now that this has been treated, improved this AM Repeat CMP in 1-2 weeks with PCP  Hypokalemia Repleted     Consultants: GI   Procedures: ERCP 9/17   Antibiotics: None   30 Day Unplanned Readmission Risk Score     Flowsheet Row ED to Hosp-Admission (Current) from 03/28/2023 in [facility]  30 Day Unplanned Readmission Risk Score (%) 3.93 Filed at 03/29/2023 0401           This score is the patient's risk of an unplanned readmission within 30 days of being discharged (0 -100%). The score is based on dignosis, age, lab data, medications, orders, and past utilization.    Low:  0-14.9   Medium: 15-21.9   High: 22-29.9   Extreme: 30 and above         Pain control - [place] Controlled Substance Reporting System database was reviewed. and patient was instructed, not to drive, operate heavy machinery, perform activities at heights, swimming or participation in water activities or provide baby-sitting services while on Pain, Sleep and Anxiety Medications; until their outpatient Physician has advised to do so again. Also recommended to not to take more than prescribed Pain, Sleep and Anxiety Medications.    Disposition: Home Diet recommendation:  Regular diet DISCHARGE MEDICATION: Allergies as of 03/30/2023       Reactions   Codeine Anxiety        Medication List    You have not been prescribed any medications.     Discharge Exam: Filed Weights   03/28/23 1359  Weight: 91.2 kg     Subjective: Feeling well since post-procedure, no concerns, eager to go home.   Objective: Vitals:   03/30/23 0303 03/30/23 0738  BP:  102/63  Pulse: (!) 50 (!) 59  Resp:  18  Temp:  97.6 F (36.4 C)  SpO2:  98%    Intake/Output Summary (Last 24 hours) at 03/30/2023 1111 Last data filed at 03/30/2023 0100 Gross per 24 hour  Intake 450 ml  Output 0 ml  Net 450 ml   Filed Weights   03/28/23 1359  Weight: 91.2 kg    Exam:  General:  Appears calm and comfortable and is in NAD Eyes:  EOMI, normal lids, iris ENT:  grossly normal hearing, lips & tongue, mmm Neck:  no LAD, masses or thyromegaly Cardiovascular:  RRR, no m/r/g. No LE edema.  Respiratory:   CTA bilaterally with no wheezes/rales/rhonchi.  Normal respiratory effort. Abdomen:  soft, NT, ND Skin:  no rash or induration seen on limited exam Musculoskeletal:  grossly normal tone BUE/BLE, good ROM, no bony abnormality Psychiatric:  grossly normal mood and affect, speech fluent and appropriate, AOx3 Neurologic:  CN 2-12 grossly intact, moves all extremities in coordinated fashion,  sensation intact  Data Reviewed: I have reviewed the patient's lab results since admission.  Pertinent labs for today include:  K+ 3.3 Glucose 101 AP 433, 580 on 9/16 Albumin 3.3 AST 55/ALT 190/Bili 3.2; 146/302/2.4 on 9/16 Normal CBC    Condition at discharge: improving  The results of significant diagnostics from this hospitalization (including imaging, microbiology, ancillary and laboratory) are listed below for reference.   Imaging Studies: DG C-Arm 1-60 Min-No Report  Result Date: 03/29/2023 Fluoroscopy was utilized by the requesting physician.  No radiographic interpretation.   MR ABDOMEN MRCP W WO CONTAST  Result Date: 03/22/2023 CLINICAL DATA:  Dilated common bile duct EXAM: MRI ABDOMEN WITHOUT AND WITH CONTRAST (INCLUDING MRCP) TECHNIQUE: Multiplanar multisequence MR imaging of the abdomen was performed both before and after the administration of intravenous contrast. Heavily T2-weighted images of the biliary and pancreatic ducts were obtained, and three-dimensional MRCP images were rendered by post processing. CONTRAST:  9mL GADAVIST GADOBUTROL 1 MMOL/ML IV SOLN COMPARISON:  Abdominal ultrasound, 03/15/2023, MR abdomen, 09/27/2018 FINDINGS: Lower chest: No acute abnormality. Hepatobiliary: No focal liver abnormality is seen. Status post cholecystectomy. Intra and extrahepatic biliary ductal dilatation, the common bile duct measuring up to 1.3 cm in caliber. Multiple calculi within the intrapancreatic common bile duct to the ampulla, largest measuring 1.4 cm (series 3, image 21, 20). Pancreas: Unremarkable. No pancreatic ductal dilatation or surrounding inflammatory changes. Spleen: Splenomegaly, maximum span 14.6 cm. Adrenals/Urinary Tract: Adrenal glands are unremarkable. Kidneys are normal, without renal calculi, solid lesion, or hydronephrosis. Stomach/Bowel: Stomach is within normal limits. No evidence of bowel wall thickening, distention, or inflammatory changes.  Vascular/Lymphatic: No significant vascular findings are present. No enlarged abdominal lymph nodes. Other: No abdominal wall hernia or abnormality. No ascites. Musculoskeletal: No acute or significant osseous findings. IMPRESSION: 1. Status post cholecystectomy. 2. Intra and extrahepatic biliary ductal dilatation, the common bile duct measuring up to 1.3 cm in caliber. Multiple calculi within the intrapancreatic common bile duct to the ampulla, largest measuring 1.4 cm. 3. Splenomegaly. These results will be called to the ordering clinician or representative by the Radiologist Assistant, and communication documented in the PACS or [employer]. Electronically Signed   By: Jearld Lesch M.D.   On: 03/22/2023 06:35   MR 3D Recon At Scanner  Result Date: 03/22/2023 CLINICAL DATA:  Dilated common bile duct EXAM: MRI ABDOMEN WITHOUT AND WITH CONTRAST (INCLUDING MRCP) TECHNIQUE: Multiplanar multisequence MR imaging of the abdomen was performed both before and after the administration of intravenous contrast. Heavily T2-weighted images of the biliary and pancreatic ducts were obtained, and three-dimensional MRCP images were rendered by post processing. CONTRAST:  9mL GADAVIST GADOBUTROL 1 MMOL/ML IV SOLN COMPARISON:  Abdominal ultrasound, 03/15/2023, MR abdomen, 09/27/2018 FINDINGS: Lower chest: No acute abnormality. Hepatobiliary: No focal liver abnormality is seen. Status post cholecystectomy. Intra and extrahepatic biliary ductal dilatation, the common bile duct measuring up to 1.3 cm in caliber. Multiple calculi within the intrapancreatic common bile duct  to the ampulla, largest measuring 1.4 cm (series 3, image 21, 20). Pancreas: Unremarkable. No pancreatic ductal dilatation or surrounding inflammatory changes. Spleen: Splenomegaly, maximum span 14.6 cm. Adrenals/Urinary Tract: Adrenal glands are unremarkable. Kidneys are normal, without renal calculi, solid lesion, or hydronephrosis. Stomach/Bowel: Stomach  is within normal limits. No evidence of bowel wall thickening, distention, or inflammatory changes. Vascular/Lymphatic: No significant vascular findings are present. No enlarged abdominal lymph nodes. Other: No abdominal wall hernia or abnormality. No ascites. Musculoskeletal: No acute or significant osseous findings. IMPRESSION: 1. Status post cholecystectomy. 2. Intra and extrahepatic biliary ductal dilatation, the common bile duct measuring up to 1.3 cm in caliber. Multiple calculi within the intrapancreatic common bile duct to the ampulla, largest measuring 1.4 cm. 3. Splenomegaly. These results will be called to the ordering clinician or representative by the Radiologist Assistant, and communication documented in the PACS or [employer]. Electronically Signed   By: Jearld Lesch M.D.   On: 03/22/2023 06:35   US ABDOMEN LIMITED RUQ (LIVER/GB)  Result Date: 03/15/2023 CLINICAL DATA:  Elevated LFTs. EXAM: ULTRASOUND ABDOMEN LIMITED RIGHT UPPER QUADRANT COMPARISON:  MR abdomen 09/27/2018 FINDINGS: Gallbladder: Surgically absent Common bile duct: Diameter: 11.1 mm, prominent Liver: No focal lesion identified. Within normal limits in parenchymal echogenicity. Portal vein is patent on color Doppler imaging with normal direction of blood flow towards the liver. Other: None. IMPRESSION: 1. Status post cholecystectomy. 2. Prominence of the common bile duct measuring up to 11.1 mm. Correlate with LFTs. Electronically Signed   By: Annia Belt M.D.   On: 03/15/2023 08:57    Microbiology: Results for orders placed or performed in visit on 04/25/19  Novel Coronavirus, NAA ([facility])     Status: None   Collection Time: 04/25/19 11:59 AM   Specimen: Nasopharyngeal(NP) swabs in vial transport medium   NASOPHARYNGE  TESTING  Result Value Ref Range Status   SARS-CoV-2, NAA Not Detected Not Detected Final    Comment: This nucleic acid amplification test was developed and its performance characteristics determined  by [employer]. Nucleic acid amplification tests include PCR and TMA. This test has not been FDA cleared or approved. This test has been authorized by FDA under an Emergency Use Authorization (EUA). This test is only authorized for the duration of time the declaration that circumstances exist justifying the authorization of the emergency use of in vitro diagnostic tests for detection of SARS-CoV-2 virus and/or diagnosis of COVID-19 infection under section 564(b)(1) of the Act, 21 U.S.C. 865HQI-6(N) (1), unless the authorization is terminated or revoked sooner. When diagnostic testing is negative, the possibility of a false negative result should be considered in the context of a patient's recent exposures and the presence of clinical signs and symptoms consistent with COVID-19. An individual without symptoms of COVID-19 and who is not shedding SARS-CoV-2 virus would  expect to have a negative (not detected) result in this assay.     Labs: CBC: Recent Labs  Lab 03/28/23 1402 03/30/23 0508  WBC 10.2 4.8  NEUTROABS  --  3.2  HGB 15.6 13.5  HCT 46.7 41.1  MCV 89.5 90.9  PLT 242 160   Basic Metabolic Panel: Recent Labs  Lab 03/28/23 1402 03/30/23 0508  NA 138 141  K 3.9 3.3*  CL 101 103  CO2 27 28  GLUCOSE 92 101*  BUN 16 17  CREATININE 0.86 0.92  CALCIUM 9.4 9.0   Liver Function Tests: Recent Labs  Lab 03/28/23 1402 03/30/23 0508  AST 146* 55*  ALT  302* 190*  ALKPHOS 580* 433*  BILITOT 2.4* 3.2*  PROT 8.1 6.6  ALBUMIN 4.4 3.3*   CBG: No results for input(s): "GLUCAP" in the last 168 hours.  Discharge time spent: greater than 30 minutes.  Signed: Jonah Blue, MD [facility] 03/30/2023

## 2023-03-31 ENCOUNTER — Telehealth: Payer: Self-pay

## 2023-03-31 NOTE — Transitions of Care (Post Inpatient/ED Visit) (Signed)
03/31/2023  Name: William Dennis MRN: [id number] DOB: April 21, 1986  Today's TOC FU Call Status: Today's TOC FU Call Status:: Successful TOC FU Call Completed TOC FU Call Complete Date: 03/31/23 Patient's Name and Date of Birth confirmed.  Transition Care Management Follow-up Telephone Call Date of Discharge: 03/30/23 Discharge Facility: [facility]) Type of Discharge: Inpatient Admission Primary Inpatient Discharge Diagnosis:: calculus of bile duct How have you been since you were released from the hospital?: Better Any questions or concerns?: No  Items Reviewed: Did you receive and understand the discharge instructions provided?: Yes Medications obtained,verified, and reconciled?: Yes (Medications Reviewed) Any new allergies since your discharge?: No Dietary orders reviewed?: Yes Do you have support at home?: Yes People in Home: spouse  Medications Reviewed Today: Medications Reviewed Today     Reviewed by Karena Addison, LPN (Licensed Practical Nurse) on 03/31/23 at [phone]  Med List Status: <None>   Medication Order Taking? Sig Documenting Provider Last Dose Status Informant           No Medications to Display                            Home Care and Equipment/Supplies: Were Home Health Services Ordered?: NA Any new equipment or medical supplies ordered?: NA  Functional Questionnaire: Do you need assistance with bathing/showering or dressing?: No Do you need assistance with meal preparation?: No Do you need assistance with eating?: No Do you have difficulty maintaining continence: No Do you need assistance with getting out of bed/getting out of a chair/moving?: No Do you have difficulty managing or taking your medications?: No  Follow up appointments reviewed: PCP Follow-up appointment confirmed?: Yes Date of PCP follow-up appointment?: 04/11/23 Follow-up Provider: [facility] Follow-up appointment confirmed?: NA Do  you need transportation to your follow-up appointment?: No Do you understand care options if your condition(s) worsen?: Yes-patient verbalized understanding    SIGNATURE Karena Addison, LPN [facility] Nurse Health Advisor Direct Dial [phone]

## 2023-04-11 ENCOUNTER — Inpatient Hospital Stay: Payer: BC Managed Care – PPO | Admitting: Family Medicine

## 2023-04-12 ENCOUNTER — Ambulatory Visit: Payer: BC Managed Care – PPO | Admitting: Physician Assistant

## 2023-04-12 ENCOUNTER — Encounter: Payer: Self-pay | Admitting: Physician Assistant

## 2023-04-12 VITALS — BP 118/74 | HR 88 | Temp 97.8°F | Resp 16 | Ht 71.0 in | Wt 203.7 lb

## 2023-04-12 DIAGNOSIS — K8050 Calculus of bile duct without cholangitis or cholecystitis without obstruction: Secondary | ICD-10-CM | POA: Diagnosis not present

## 2023-04-12 DIAGNOSIS — R7989 Other specified abnormal findings of blood chemistry: Secondary | ICD-10-CM | POA: Diagnosis not present

## 2023-04-12 DIAGNOSIS — Z9049 Acquired absence of other specified parts of digestive tract: Secondary | ICD-10-CM | POA: Diagnosis not present

## 2023-04-12 NOTE — Assessment & Plan Note (Addendum)
Chronic concern, Acutely exacerbated in Sept- resolved now He had cholecystectomy performed in 2020- suspect this current exacerbation is likely secondary to previous condition Reviewed hospital D/c notes, ERCP procedure notes from hospitalization Reports complete resolution of abdominal pain and symptoms since ERCP during hospitalization Had some elevated LFTs- will repeat CMP and CBC for monitoring Results to dictate further management  Reviewed dietary measures to reduce stone formation and assist with prevention of recurrence Follow up as needed for persistent or progressing symptoms

## 2023-04-12 NOTE — Progress Notes (Signed)
Acute Office Visit   Patient: William Dennis   DOB: Jul 10, 1986   37 y.o. Male  MRN: [id number] Visit Date: 04/12/2023  Today's healthcare provider: Oswaldo Conroy Elica Almas, PA-C  Introduced myself to the patient as a Secondary school teacher and provided education on APPs in clinical practice.    Chief Complaint  Patient presents with   Hospitalization Follow-up    [id number], repeat CMP, pt states feeling better   Subjective    HPI HPI     Hospitalization Follow-up    Additional comments: [id number], repeat CMP, pt states feeling better      Last edited by Forde Radon, CMA on 04/12/2023  9:24 AM.      He reports symptoms have resolved since Hospitalization Stent and stones were removed this last time with ERCP  He denies abdominal pain, diarrhea, constipation, nausea   Medications: No outpatient medications prior to visit.   No facility-administered medications prior to visit.    Review of Systems  Constitutional:  Negative for chills and fever.  Respiratory:  Negative for shortness of breath.   Cardiovascular:  Negative for chest pain.  Gastrointestinal:  Negative for abdominal distention, abdominal pain, constipation, diarrhea, nausea and vomiting.  Neurological:  Negative for dizziness and headaches.        Objective    BP 118/74   Pulse 88   Temp 97.8 F (36.6 C) (Oral)   Resp 16   Ht 5\' 11"  (1.803 m)   Wt 203 lb 11.2 oz (92.4 kg)   SpO2 97%   BMI 28.41 kg/m     Physical Exam Vitals reviewed.  Constitutional:      General: He is awake.     Appearance: Normal appearance. He is well-developed and well-groomed.  HENT:     Head: Normocephalic and atraumatic.  Pulmonary:     Effort: Pulmonary effort is normal.  Abdominal:     General: Abdomen is flat.     Palpations: Abdomen is soft.  Musculoskeletal:     Cervical back: Normal range of motion and neck supple.  Neurological:     Mental Status: He is alert.  Psychiatric:         Attention and Perception: Attention and perception normal.        Mood and Affect: Mood and affect normal.        Behavior: Behavior is cooperative.       No results found for any visits on 04/12/23.  Assessment & Plan      Return in about 3 months (around 07/13/2023) for Annual physical.      Problem List Items Addressed This Visit       Digestive   Choledocholithiasis - Primary    Chronic concern, Acutely exacerbated in Sept- resolved now He had cholecystectomy performed in 2020- suspect this current exacerbation is likely secondary to previous condition Reviewed hospital D/c notes, ERCP procedure notes from hospitalization Reports complete resolution of abdominal pain and symptoms since ERCP during hospitalization Had some elevated LFTs- will repeat CMP and CBC for monitoring Results to dictate further management  Reviewed dietary measures to reduce stone formation and assist with prevention of recurrence Follow up as needed for persistent or progressing symptoms          Relevant Orders   COMPLETE METABOLIC PANEL WITH GFR   CBC w/Diff/Platelet     Other   S/P cholecystectomy   Relevant Orders   COMPLETE METABOLIC PANEL WITH GFR   CBC  w/Diff/Platelet   Elevated LFTs   Relevant Orders   COMPLETE METABOLIC PANEL WITH GFR   CBC w/Diff/Platelet     Return in about 3 months (around 07/13/2023) for Annual physical.   I, Humberto Addo E Ryman Rathgeber, PA-C, have reviewed all documentation for this visit. The documentation on 04/12/23 for the exam, diagnosis, procedures, and orders are all accurate and complete.   Jacquelin Hawking, MHS, PA-C [facility]

## 2023-04-13 LAB — CBC WITH DIFFERENTIAL/PLATELET
Absolute Monocytes: 531 {cells}/uL (ref 200–950)
Basophils Absolute: 49 {cells}/uL (ref 0–200)
Basophils Relative: 0.8 %
Eosinophils Absolute: 122 {cells}/uL (ref 15–500)
Eosinophils Relative: 2 %
HCT: 49.5 % (ref 38.5–50.0)
Hemoglobin: 16.3 g/dL (ref 13.2–17.1)
Lymphs Abs: 2647 {cells}/uL (ref 850–3900)
MCH: 29.6 pg (ref 27.0–33.0)
MCHC: 32.9 g/dL (ref 32.0–36.0)
MCV: 90 fL (ref 80.0–100.0)
MPV: 12.5 fL (ref 7.5–12.5)
Monocytes Relative: 8.7 %
Neutro Abs: 2751 {cells}/uL (ref 1500–7800)
Neutrophils Relative %: 45.1 %
Platelets: 267 10*3/uL (ref 140–400)
RBC: 5.5 10*6/uL (ref 4.20–5.80)
RDW: 12.6 % (ref 11.0–15.0)
Total Lymphocyte: 43.4 %
WBC: 6.1 10*3/uL (ref 3.8–10.8)

## 2023-04-13 LAB — COMPLETE METABOLIC PANEL WITH GFR
AG Ratio: 1.5 (calc) (ref 1.0–2.5)
ALT: 36 U/L (ref 9–46)
AST: 23 U/L (ref 10–40)
Albumin: 4.6 g/dL (ref 3.6–5.1)
Alkaline phosphatase (APISO): 232 U/L — ABNORMAL HIGH (ref 36–130)
BUN: 19 mg/dL (ref 7–25)
CO2: 27 mmol/L (ref 20–32)
Calcium: 10.4 mg/dL — ABNORMAL HIGH (ref 8.6–10.3)
Chloride: 103 mmol/L (ref 98–110)
Creat: 1.02 mg/dL (ref 0.60–1.26)
Globulin: 3.1 g/dL (ref 1.9–3.7)
Glucose, Bld: 73 mg/dL (ref 65–99)
Potassium: 4.5 mmol/L (ref 3.5–5.3)
Sodium: 140 mmol/L (ref 135–146)
Total Bilirubin: 0.7 mg/dL (ref 0.2–1.2)
Total Protein: 7.7 g/dL (ref 6.1–8.1)
eGFR: 98 mL/min/{1.73_m2} (ref >=60)

## 2023-04-19 NOTE — Progress Notes (Signed)
Your labs are back Your electrolytes were overall normal with the exception of your calcium which was a bit elevated.  For now I recommend reducing your dietary calcium intake and staying well-hydrated to assist with managing this. Your alkaline phosphatase was a little bit elevated but appears to be coming down since it was checked 2 months ago.  Your other liver enzymes have returned to normal range as well.  I recommend that we continue to keep an eye on this to make sure that it returns to normal levels. Your CBC was overall normal.  No signs of anemia.

## 2023-05-12 ENCOUNTER — Ambulatory Visit: Admit: 2023-05-12 | Payer: BC Managed Care – PPO | Admitting: Gastroenterology

## 2023-05-12 SURGERY — ENDOSCOPIC RETROGRADE CHOLANGIOPANCREATOGRAPHY (ERCP) WITH PROPOFOL
Anesthesia: General

## 2023-05-13 DIAGNOSIS — R5381 Other malaise: Secondary | ICD-10-CM | POA: Diagnosis not present

## 2023-05-13 DIAGNOSIS — J069 Acute upper respiratory infection, unspecified: Secondary | ICD-10-CM | POA: Diagnosis not present

## 2023-05-14 ENCOUNTER — Emergency Department
Admission: EM | Admit: 2023-05-14 | Discharge: 2023-05-14 | Disposition: A | Discharge status: Home / Self Care | Payer: BC Managed Care – PPO | Attending: Emergency Medicine | Admitting: Emergency Medicine

## 2023-05-14 ENCOUNTER — Other Ambulatory Visit: Payer: Self-pay

## 2023-05-14 ENCOUNTER — Emergency Department: Payer: BC Managed Care – PPO

## 2023-05-14 ENCOUNTER — Encounter: Payer: Self-pay | Admitting: Emergency Medicine

## 2023-05-14 DIAGNOSIS — J189 Pneumonia, unspecified organism: Secondary | ICD-10-CM | POA: Diagnosis not present

## 2023-05-14 DIAGNOSIS — J181 Lobar pneumonia, unspecified organism: Secondary | ICD-10-CM | POA: Insufficient documentation

## 2023-05-14 DIAGNOSIS — R918 Other nonspecific abnormal finding of lung field: Secondary | ICD-10-CM | POA: Diagnosis not present

## 2023-05-14 DIAGNOSIS — R059 Cough, unspecified: Secondary | ICD-10-CM | POA: Diagnosis not present

## 2023-05-14 DIAGNOSIS — R509 Fever, unspecified: Secondary | ICD-10-CM | POA: Diagnosis not present

## 2023-05-14 MED ORDER — AZITHROMYCIN 250 MG PO TABS: ORAL_TABLET | ORAL | 0 refills | Status: AC

## 2023-05-14 MED ORDER — CEPHALEXIN 500 MG PO CAPS: 500.0000 mg | ORAL_CAPSULE | Freq: Three times a day (TID) | ORAL | 0 refills | Status: AC

## 2023-05-14 NOTE — ED Triage Notes (Signed)
Pt to ED via POV for body aches, fever, chills, and cough. Pt states that he was seen at [facility] and they swabbed him for COVID and flu but both test were negative. Pt states that his fever spikes twice a day. Pt afebrile in triage at this time. Pt is in NAD.

## 2023-05-14 NOTE — ED Provider Notes (Signed)
   [facility] Provider Note    Event Date/Time   First MD Initiated Contact with Patient 05/14/23 [phone]     (approximate)   History   Fever, Cough, and Chills   HPI  William Dennis is a 37 y.o. male who presents with cough, fever, chills for about 3 days now.  He reports his daughter was recently diagnosed with pneumonia.  Nondiabetic     Physical Exam   Triage Vital Signs: ED Triage Vitals  Encounter Vitals Group     BP 05/14/23 0612 123/89     Systolic BP Percentile --      Diastolic BP Percentile --      Pulse Rate 05/14/23 0612 (!) 103     Resp 05/14/23 0612 18     Temp 05/14/23 0612 98.4 F (36.9 C)     Temp Source 05/14/23 0612 Oral     SpO2 05/14/23 0612 93 %     Weight 05/14/23 0613 91.6 kg (202 lb)     Height 05/14/23 0613 1.829 m (6')     Head Circumference --      Peak Flow --      Pain Score 05/14/23 0613 0     Pain Loc --      Pain Education --      Exclude from Growth Chart --     Most recent vital signs: Vitals:   05/14/23 0612  BP: 123/89  Pulse: (!) 103  Resp: 18  Temp: 98.4 F (36.9 C)  SpO2: 93%     General: Awake, no distress.  CV:  Good peripheral perfusion.  Resp:  Normal effort.  Clear to auscultation bilaterally Abd:  No distention.  Other:     ED Results / Procedures / Treatments   Labs (all labs ordered are listed, but only abnormal results are displayed) Labs Reviewed - No data to display   EKG     RADIOLOGY Chest x-ray viewed interpret by me, right upper lobe infiltrate    PROCEDURES:  Critical Care performed:   Procedures   MEDICATIONS ORDERED IN ED: Medications - No data to display   IMPRESSION / MDM / ASSESSMENT AND PLAN / ED COURSE  I reviewed the triage vital signs and the nursing notes. Patient's presentation is most consistent with acute presentation with potential threat to life or bodily function.  Patient presents with shortness of breath, cough, fevers as  detailed above.  Differential includes upper respiratory infection, pneumonia, doubt pneumothorax  Chest x-ray is negative for pneumothorax, positive for infiltrate consistent with community-acquired pneumonia  Will start the patient on Keflex, Z-Pak, outpatient follow-up as needed, no indication for admission at this time.  Return precautions discussed      FINAL CLINICAL IMPRESSION(S) / ED DIAGNOSES   Final diagnoses:  Community acquired pneumonia of right middle lobe of lung     Rx / DC Orders   ED Discharge Orders          Ordered    cephALEXin (KEFLEX) 500 MG capsule  3 times daily        05/14/23 0736    azithromycin (ZITHROMAX Z-PAK) 250 MG tablet        05/14/23 1191             Note:  This document was prepared using Dragon voice recognition software and may include unintentional dictation errors.   Jene Every, MD 05/14/23 [phone]

## 2023-06-16 DIAGNOSIS — H66002 Acute suppurative otitis media without spontaneous rupture of ear drum, left ear: Secondary | ICD-10-CM | POA: Diagnosis not present

## 2023-07-21 ENCOUNTER — Encounter: Payer: BC Managed Care – PPO | Admitting: Physician Assistant

## 2024-03-19 ENCOUNTER — Ambulatory Visit
Admission: RE | Admit: 2024-03-19 | Discharge: 2024-03-19 | Disposition: A | Discharge status: Home / Self Care | Source: Ambulatory Visit

## 2024-03-19 VITALS — BP 118/79 | HR 93 | Temp 98.2°F | Resp 18

## 2024-03-19 DIAGNOSIS — U071 COVID-19: Secondary | ICD-10-CM | POA: Diagnosis not present

## 2024-03-19 LAB — POCT RAPID STREP A (OFFICE): Rapid Strep A Screen: NEGATIVE

## 2024-03-19 LAB — POC COVID19/FLU A&B COMBO
Covid Antigen, POC: POSITIVE — AB
Influenza A Antigen, POC: NEGATIVE
Influenza B Antigen, POC: NEGATIVE

## 2024-03-19 MED ORDER — AZELASTINE HCL 0.1 % NA SOLN
1.0000 | Freq: Two times a day (BID) | NASAL | 1 refills | Status: AC
Start: 2024-03-19 — End: ?

## 2024-03-19 MED ORDER — PROMETHAZINE-DM 6.25-15 MG/5ML PO SYRP: 5.0000 mL | ORAL_SOLUTION | Freq: Four times a day (QID) | ORAL | 0 refills | Status: AC | PRN

## 2024-03-19 NOTE — ED Provider Notes (Signed)
 [facility]  Note:  This document was prepared using Conservation officer, historic buildings and may include unintentional dictation errors.  MRN: [id number] DOB: [birth year]  Subjective:   William Dennis is a 38 y.o. male presenting for cough, nasal congestion, sore throat x 2 days.  Patient also started having fever yesterday.  Patient reports that pain is 6 out of 10.  Denies any chest pain, shortness of breath, weakness, dizziness.  Patient has not taken any over-the-counter medication to treat symptoms.  Patient denies any known exposure to anyone positive for COVID or flu.  States that his daughter has similar symptoms.  No current facility-administered medications for this encounter.  Current Outpatient Medications:    azelastine  (ASTELIN ) 0.1 % nasal spray, Place 1 spray into both nostrils 2 (two) times daily. Use in each nostril as directed, Disp: 30 mL, Rfl: 1   promethazine -dextromethorphan  (PROMETHAZINE -DM) 6.25-15 MG/5ML syrup, Take 5 mLs by mouth 4 (four) times daily as needed for cough., Disp: 118 mL, Rfl: 0   Allergies  Allergen Reactions   Codeine Anxiety    Past Medical History:  Diagnosis Date   Acute cholecystitis 08/25/2018   Anxiety    Cholecystitis    Common bile duct leak    Migraines      Past Surgical History:  Procedure Laterality Date   CHOLECYSTECTOMY N/A 08/26/2018   Procedure: LAPAROSCOPIC CHOLECYSTECTOMY changed to open;  Surgeon: Tye Millet, DO;  Location: [facility] ORS;  Service: General;  Laterality: N/A;   CHOLECYSTECTOMY  08/26/2018   Procedure: CHOLECYSTECTOMY;  Surgeon: Tye Millet, DO;  Location: [facility] ORS;  Service: General;;   ERCP N/A 08/28/2018   Procedure: ENDOSCOPIC RETROGRADE CHOLANGIOPANCREATOGRAPHY (ERCP);  Surgeon: Jinny Carmine, MD;  Location: [facility] ENDOSCOPY;  Service: Endoscopy;  Laterality: N/A;   ERCP N/A 03/29/2023   Procedure: ENDOSCOPIC RETROGRADE CHOLANGIOPANCREATOGRAPHY (ERCP);  Surgeon: Jinny Carmine, MD;  Location:  [facility] ENDOSCOPY;  Service: Endoscopy;  Laterality: N/A;   GASTROINTESTINAL STENT REMOVAL  03/29/2023   Procedure: GASTROINTESTINAL STENT REMOVAL;  Surgeon: Jinny Carmine, MD;  Location: [facility] ENDOSCOPY;  Service: Endoscopy;;    Family History  Problem Relation Age of Onset   Depression Mother    Anxiety disorder Mother    Diabetes Father    Bleeding Disorder Father    Hyperlipidemia Father    Hypertension Father    Diabetes Sister    Depression Sister    Heart attack Maternal Grandfather 40   Breast cancer Paternal Grandmother    Depression Sister    Anxiety disorder Sister     Social History   Tobacco Use   Smoking status: Never   Smokeless tobacco: Never  Vaping Use   Vaping status: Never Used  Substance Use Topics   Alcohol use: Not Currently    Comment: Drinks once every few months   Drug use: Never    ROS Refer to HPI for ROS details.  Objective:   Vitals: BP 118/79 (BP Location: Right Arm)   Pulse 93   Temp 98.2 F (36.8 C) (Oral)   Resp 18   SpO2 98%   Physical Exam Vitals and nursing note reviewed.  Constitutional:      General: He is not in acute distress.    Appearance: Normal appearance. He is well-developed. He is not ill-appearing or toxic-appearing.  HENT:     Head: Normocephalic.     Nose: Congestion present.     Mouth/Throat:     Mouth: Mucous membranes are moist.     Pharynx: Posterior  oropharyngeal erythema present. No oropharyngeal exudate.  Eyes:     Extraocular Movements: Extraocular movements intact.     Conjunctiva/sclera: Conjunctivae normal.  Cardiovascular:     Rate and Rhythm: Normal rate.  Pulmonary:     Effort: Pulmonary effort is normal. No respiratory distress.  Musculoskeletal:     Cervical back: Normal range of motion and neck supple. No tenderness.  Lymphadenopathy:     Cervical: No cervical adenopathy.  Skin:    General: Skin is warm and dry.  Neurological:     General: No focal deficit present.     Mental Status:  He is alert and oriented to person, place, and time.  Psychiatric:        Mood and Affect: Mood normal.        Behavior: Behavior normal.     Procedures  Results for orders placed or performed during the hospital encounter of 03/19/24 (from the past 24 hours)  POC Covid19/Flu A&B Antigen     Status: Abnormal   Collection Time: 03/19/24 10:24 AM  Result Value Ref Range   Influenza A Antigen, POC Negative Negative   Influenza B Antigen, POC Negative Negative   Covid Antigen, POC Positive (A) Negative  POCT rapid strep A     Status: Normal   Collection Time: 03/19/24 10:24 AM  Result Value Ref Range   Rapid Strep A Screen Negative Negative    No results found.   Assessment and Plan :     Discharge Instructions       1. COVID-19 (Primary) - POC Covid19/Flu A&B Antigen completed in UC is negative for influenza and positive for COVID-19 - POCT rapid strep A complete in UC is negative for strep pharyngitis - azelastine  (ASTELIN ) 0.1 % nasal spray; Place 1 spray into both nostrils 2 (two) times daily. Use in each nostril as directed  Dispense: 30 mL; Refill: 1 - promethazine -dextromethorphan  (PROMETHAZINE -DM) 6.25-15 MG/5ML syrup; Take 5 mLs by mouth 4 (four) times daily as needed for cough.  Dispense: 118 mL; Refill: 0 - Take over-the-counter ibuprofen  or Tylenol  for any body aches and/or headache secondary to viral illness. -Continue to monitor symptoms for any change in severity if there is any escalation of current symptoms or development of new symptoms follow-up in ER for further evaluation and management.  Home Care Instructions for Coronavirus Disease 2019 (COVID-19) Isolation and Infection Prevention  Stay at home and isolate in a single, well-ventilated room, using a separate bathroom if possible. Avoid contact with other household members and pets. If sharing space, wear a well-fitting mask and maintain physical distance.[5-7]  The [place]  Centers for Disease  Control and Prevention (CDC) recommends remaining isolated until at least 24 hours after both symptoms are improving and there has been no fever without the use of fever-reducing medications. For 5 additional days, continue to use precautions such as mask-wearing, hand hygiene, and physical distancing.[5]  Practice frequent handwashing with soap and water for at least 20 seconds, or use hand sanitizer with at least 60% alcohol.[5]  Clean and disinfect high-touch surfaces daily.[5][7] Symptom Monitoring and Supportive Care  Monitor for worsening symptoms, especially shortness of breath, chest pain, confusion, or persistent high fever. Use a pulse oximeter if available, especially for those at higher risk (e.g., older adults, people with chronic conditions).[1][4]  Supportive care includes rest, hydration, and over-the-counter medications for fever and pain (e.g., acetaminophen ). There is no evidence for specific antiviral therapy in mild cases managed at home.[1-2][8]  Maintain a balanced  diet and adequate fluid intake.[7] When to Seek Medical Attention  Seek immediate care if experiencing difficulty breathing, chest pain, new confusion, inability to wake or stay awake, or bluish lips or face.[1-2][4][8] Household Member [employer] contacts should minimize exposure, wear masks, and monitor for symptoms for at least 14 days after last contact with the infected person.[3][5][7]  Testing is recommended for close contacts, ideally 5-7 days after exposure, and sooner if symptoms develop.[3] Summary Most people with mild COVID-19 recover at home with supportive care and infection control measures. Early recognition of worsening symptoms and prompt escalation of care are essential for safety.[1-2][4-8]       Ethel KATHEE Aguas   [place], [place], [place] 03/19/24 1032

## 2024-03-19 NOTE — ED Triage Notes (Signed)
 Patient reports cough, congestion and sore throat x 2 days. Patient reports a fever that started yesterday. Rates pain 6/10.

## 2024-03-19 NOTE — Discharge Instructions (Addendum)
  1. COVID-19 (Primary) - POC Covid19/Flu A&B Antigen completed in UC is negative for influenza and positive for COVID-19 - POCT rapid strep A complete in UC is negative for strep pharyngitis - azelastine  (ASTELIN ) 0.1 % nasal spray; Place 1 spray into both nostrils 2 (two) times daily. Use in each nostril as directed  Dispense: 30 mL; Refill: 1 - promethazine -dextromethorphan  (PROMETHAZINE -DM) 6.25-15 MG/5ML syrup; Take 5 mLs by mouth 4 (four) times daily as needed for cough.  Dispense: 118 mL; Refill: 0 - Take over-the-counter ibuprofen  or Tylenol  for any body aches and/or headache secondary to viral illness. -Continue to monitor symptoms for any change in severity if there is any escalation of current symptoms or development of new symptoms follow-up in ER for further evaluation and management.  Home Care Instructions for Coronavirus Disease 2019 (COVID-19) Isolation and Infection Prevention  Stay at home and isolate in a single, well-ventilated room, using a separate bathroom if possible. Avoid contact with other household members and pets. If sharing space, wear a well-fitting mask and maintain physical distance.[5-7]  The [place]  Centers for Disease Control and Prevention (CDC) recommends remaining isolated until at least 24 hours after both symptoms are improving and there has been no fever without the use of fever-reducing medications. For 5 additional days, continue to use precautions such as mask-wearing, hand hygiene, and physical distancing.[5]  Practice frequent handwashing with soap and water for at least 20 seconds, or use hand sanitizer with at least 60% alcohol.[5]  Clean and disinfect high-touch surfaces daily.[5][7] Symptom Monitoring and Supportive Care  Monitor for worsening symptoms, especially shortness of breath, chest pain, confusion, or persistent high fever. Use a pulse oximeter if available, especially for those at higher risk (e.g., older adults, people with chronic  conditions).[1][4]  Supportive care includes rest, hydration, and over-the-counter medications for fever and pain (e.g., acetaminophen ). There is no evidence for specific antiviral therapy in mild cases managed at home.[1-2][8]  Maintain a balanced diet and adequate fluid intake.[7] When to Seek Medical Attention  Seek immediate care if experiencing difficulty breathing, chest pain, new confusion, inability to wake or stay awake, or bluish lips or face.[1-2][4][8] Household Member [employer] contacts should minimize exposure, wear masks, and monitor for symptoms for at least 14 days after last contact with the infected person.[3][5][7]  Testing is recommended for close contacts, ideally 5-7 days after exposure, and sooner if symptoms develop.[3] Summary Most people with mild COVID-19 recover at home with supportive care and infection control measures. Early recognition of worsening symptoms and prompt escalation of care are essential for safety.[1-2][4-8]
